# Patient Record
Sex: Female | Born: 1965 | Race: White | Hispanic: No | Marital: Married | Smoking: Never smoker
Health system: Southern US, Community
[De-identification: ages and names within clinical notes are randomized; demographics above are authoritative.]

## PROBLEM LIST (undated history)

## (undated) DIAGNOSIS — K635 Polyp of colon: Secondary | ICD-10-CM

## (undated) DIAGNOSIS — H269 Unspecified cataract: Secondary | ICD-10-CM

## (undated) DIAGNOSIS — M722 Plantar fascial fibromatosis: Secondary | ICD-10-CM

## (undated) DIAGNOSIS — N879 Dysplasia of cervix uteri, unspecified: Secondary | ICD-10-CM

## (undated) HISTORY — DX: Polyp of colon: K63.5

## (undated) HISTORY — PX: COLPOSCOPY: SHX161

## (undated) HISTORY — DX: Plantar fascial fibromatosis: M72.2

## (undated) HISTORY — DX: Unspecified cataract: H26.9

## (undated) HISTORY — PX: CATARACT EXTRACTION: SUR2

## (undated) HISTORY — DX: Dysplasia of cervix uteri, unspecified: N87.9

---
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
## 1998-01-15 DIAGNOSIS — N879 Dysplasia of cervix uteri, unspecified: Secondary | ICD-10-CM

## 1998-01-15 HISTORY — DX: Dysplasia of cervix uteri, unspecified: N87.9

## 1998-06-06 ENCOUNTER — Other Ambulatory Visit: Admission: RE | Admit: 1998-06-06 | Discharge: 1998-06-06 | Payer: Self-pay | Admitting: Gynecology

## 1999-08-29 ENCOUNTER — Other Ambulatory Visit: Admission: RE | Admit: 1999-08-29 | Discharge: 1999-08-29 | Payer: Self-pay | Admitting: Gynecology

## 2001-03-05 ENCOUNTER — Other Ambulatory Visit: Admission: RE | Admit: 2001-03-05 | Discharge: 2001-03-05 | Payer: Self-pay | Admitting: Gynecology

## 2001-06-05 ENCOUNTER — Encounter: Admission: RE | Admit: 2001-06-05 | Discharge: 2001-06-05 | Payer: Self-pay | Admitting: Gynecology

## 2001-06-05 ENCOUNTER — Encounter: Payer: Self-pay | Admitting: Gynecology

## 2002-03-06 ENCOUNTER — Other Ambulatory Visit: Admission: RE | Admit: 2002-03-06 | Discharge: 2002-03-06 | Payer: Self-pay | Admitting: Gynecology

## 2003-03-15 ENCOUNTER — Other Ambulatory Visit: Admission: RE | Admit: 2003-03-15 | Discharge: 2003-03-15 | Payer: Self-pay | Admitting: Gynecology

## 2004-03-17 ENCOUNTER — Other Ambulatory Visit: Admission: RE | Admit: 2004-03-17 | Discharge: 2004-03-17 | Payer: Self-pay | Admitting: Gynecology

## 2004-12-25 ENCOUNTER — Emergency Department (HOSPITAL_COMMUNITY): Admission: EM | Admit: 2004-12-25 | Discharge: 2004-12-25 | Payer: Self-pay | Admitting: Emergency Medicine

## 2005-01-01 ENCOUNTER — Ambulatory Visit: Payer: Self-pay | Admitting: Family Medicine

## 2005-03-05 ENCOUNTER — Inpatient Hospital Stay (HOSPITAL_COMMUNITY): Admission: AD | Admit: 2005-03-05 | Discharge: 2005-03-05 | Payer: Self-pay | Admitting: Gynecology

## 2005-03-06 ENCOUNTER — Inpatient Hospital Stay (HOSPITAL_COMMUNITY): Admission: AD | Admit: 2005-03-06 | Discharge: 2005-03-09 | Payer: Self-pay | Admitting: Gynecology

## 2005-03-06 ENCOUNTER — Encounter (INDEPENDENT_AMBULATORY_CARE_PROVIDER_SITE_OTHER): Payer: Self-pay | Admitting: Specialist

## 2005-04-24 ENCOUNTER — Other Ambulatory Visit: Admission: RE | Admit: 2005-04-24 | Discharge: 2005-04-24 | Payer: Self-pay | Admitting: Gynecology

## 2005-07-14 ENCOUNTER — Ambulatory Visit: Payer: Self-pay | Admitting: Internal Medicine

## 2005-12-27 ENCOUNTER — Ambulatory Visit: Payer: Self-pay | Admitting: Internal Medicine

## 2006-05-03 ENCOUNTER — Other Ambulatory Visit: Admission: RE | Admit: 2006-05-03 | Discharge: 2006-05-03 | Payer: Self-pay | Admitting: Gynecology

## 2006-06-20 ENCOUNTER — Ambulatory Visit: Payer: Self-pay | Admitting: Internal Medicine

## 2006-06-25 ENCOUNTER — Encounter: Admission: RE | Admit: 2006-06-25 | Discharge: 2006-06-25 | Payer: Self-pay | Admitting: Gynecology

## 2006-07-25 ENCOUNTER — Ambulatory Visit: Payer: Self-pay | Admitting: Family Medicine

## 2006-07-26 DIAGNOSIS — G43909 Migraine, unspecified, not intractable, without status migrainosus: Secondary | ICD-10-CM | POA: Insufficient documentation

## 2006-08-27 ENCOUNTER — Ambulatory Visit: Payer: Self-pay | Admitting: Family Medicine

## 2006-08-27 DIAGNOSIS — R51 Headache: Secondary | ICD-10-CM | POA: Insufficient documentation

## 2006-08-27 DIAGNOSIS — R519 Headache, unspecified: Secondary | ICD-10-CM | POA: Insufficient documentation

## 2006-08-27 DIAGNOSIS — D1779 Benign lipomatous neoplasm of other sites: Secondary | ICD-10-CM | POA: Insufficient documentation

## 2006-09-21 ENCOUNTER — Inpatient Hospital Stay (HOSPITAL_COMMUNITY): Admission: AD | Admit: 2006-09-21 | Discharge: 2006-09-21 | Payer: Self-pay | Admitting: Obstetrics and Gynecology

## 2006-12-23 ENCOUNTER — Ambulatory Visit: Payer: Self-pay | Admitting: Internal Medicine

## 2006-12-23 ENCOUNTER — Telehealth: Payer: Self-pay | Admitting: Internal Medicine

## 2007-06-17 ENCOUNTER — Telehealth (INDEPENDENT_AMBULATORY_CARE_PROVIDER_SITE_OTHER): Payer: Self-pay

## 2007-06-17 ENCOUNTER — Ambulatory Visit: Payer: Self-pay | Admitting: Internal Medicine

## 2007-06-17 DIAGNOSIS — K625 Hemorrhage of anus and rectum: Secondary | ICD-10-CM | POA: Insufficient documentation

## 2007-06-18 ENCOUNTER — Ambulatory Visit: Payer: Self-pay | Admitting: Internal Medicine

## 2007-06-18 DIAGNOSIS — R198 Other specified symptoms and signs involving the digestive system and abdomen: Secondary | ICD-10-CM | POA: Insufficient documentation

## 2007-06-18 DIAGNOSIS — K644 Residual hemorrhoidal skin tags: Secondary | ICD-10-CM | POA: Insufficient documentation

## 2007-10-30 ENCOUNTER — Inpatient Hospital Stay (HOSPITAL_COMMUNITY): Admission: RE | Admit: 2007-10-30 | Discharge: 2007-11-02 | Payer: Self-pay | Admitting: Obstetrics and Gynecology

## 2008-09-06 ENCOUNTER — Encounter: Admission: RE | Admit: 2008-09-06 | Discharge: 2008-09-06 | Payer: Self-pay | Admitting: Gynecology

## 2008-09-09 ENCOUNTER — Encounter: Admission: RE | Admit: 2008-09-09 | Discharge: 2008-09-09 | Payer: Self-pay | Admitting: Gynecology

## 2008-12-16 ENCOUNTER — Other Ambulatory Visit: Admission: RE | Admit: 2008-12-16 | Discharge: 2008-12-16 | Payer: Self-pay | Admitting: Gynecology

## 2008-12-16 ENCOUNTER — Ambulatory Visit: Payer: Self-pay | Admitting: Gynecology

## 2009-01-24 ENCOUNTER — Ambulatory Visit: Payer: Self-pay | Admitting: Family Medicine

## 2009-03-21 ENCOUNTER — Ambulatory Visit: Payer: Self-pay | Admitting: Internal Medicine

## 2009-03-21 LAB — CONVERTED CEMR LAB
Blood in Urine, dipstick: NEGATIVE
Glucose, Urine, Semiquant: NEGATIVE
Ketones, urine, test strip: NEGATIVE
Nitrite: NEGATIVE
Protein, U semiquant: NEGATIVE
Specific Gravity, Urine: 1.01
WBC Urine, dipstick: NEGATIVE
pH: 7.5

## 2009-03-25 ENCOUNTER — Encounter: Admission: RE | Admit: 2009-03-25 | Discharge: 2009-03-25 | Payer: Self-pay | Admitting: Internal Medicine

## 2009-03-29 ENCOUNTER — Encounter: Admission: RE | Admit: 2009-03-29 | Discharge: 2009-03-29 | Payer: Self-pay | Admitting: Internal Medicine

## 2009-06-20 ENCOUNTER — Encounter: Payer: Self-pay | Admitting: Family Medicine

## 2009-06-20 ENCOUNTER — Telehealth: Payer: Self-pay | Admitting: Family Medicine

## 2009-06-20 ENCOUNTER — Ambulatory Visit: Payer: Self-pay | Admitting: Internal Medicine

## 2009-06-20 DIAGNOSIS — S8010XA Contusion of unspecified lower leg, initial encounter: Secondary | ICD-10-CM | POA: Insufficient documentation

## 2009-06-20 DIAGNOSIS — M545 Low back pain, unspecified: Secondary | ICD-10-CM | POA: Insufficient documentation

## 2009-06-20 LAB — CONVERTED CEMR LAB
Bilirubin Urine: NEGATIVE
Ketones, urine, test strip: NEGATIVE
Specific Gravity, Urine: 1.02

## 2009-06-21 ENCOUNTER — Encounter: Payer: Self-pay | Admitting: Internal Medicine

## 2009-06-21 ENCOUNTER — Telehealth: Payer: Self-pay | Admitting: *Deleted

## 2009-06-21 ENCOUNTER — Telehealth (INDEPENDENT_AMBULATORY_CARE_PROVIDER_SITE_OTHER): Payer: Self-pay | Admitting: *Deleted

## 2009-06-29 ENCOUNTER — Telehealth: Payer: Self-pay | Admitting: *Deleted

## 2009-06-30 ENCOUNTER — Ambulatory Visit: Payer: Self-pay | Admitting: Family Medicine

## 2009-06-30 DIAGNOSIS — J309 Allergic rhinitis, unspecified: Secondary | ICD-10-CM | POA: Insufficient documentation

## 2009-07-31 ENCOUNTER — Encounter: Payer: Self-pay | Admitting: Internal Medicine

## 2009-08-24 ENCOUNTER — Ambulatory Visit: Payer: Self-pay | Admitting: Family Medicine

## 2009-08-24 LAB — CONVERTED CEMR LAB
Bilirubin Urine: NEGATIVE
Glucose, Urine, Semiquant: NEGATIVE
Ketones, urine, test strip: NEGATIVE
Specific Gravity, Urine: <1.005
pH: 6.5

## 2009-08-25 ENCOUNTER — Encounter: Payer: Self-pay | Admitting: Internal Medicine

## 2009-10-14 ENCOUNTER — Encounter: Admission: RE | Admit: 2009-10-14 | Discharge: 2009-10-14 | Payer: Self-pay | Admitting: Gynecology

## 2009-12-06 ENCOUNTER — Ambulatory Visit: Payer: Self-pay | Admitting: Internal Medicine

## 2009-12-19 ENCOUNTER — Other Ambulatory Visit
Admission: RE | Admit: 2009-12-19 | Discharge: 2009-12-19 | Payer: Self-pay | Source: Home / Self Care | Admitting: Gynecology

## 2009-12-19 ENCOUNTER — Ambulatory Visit: Payer: Self-pay | Admitting: Gynecology

## 2010-02-05 ENCOUNTER — Encounter: Payer: Self-pay | Admitting: Gynecology

## 2010-02-14 NOTE — Assessment & Plan Note (Signed)
Summary: flu shot/njr  Nurse Visit   Allergies: 1)  Cipro  Orders Added: 1)  Admin 1st Vaccine [90471] 2)  Flu Vaccine 43yrs + [11914]  Flu Vaccine Consent Questions     Do you have a history of severe allergic reactions to this vaccine? no    Any prior history of allergic reactions to egg and/or gelatin? no    Do you have a sensitivity to the preservative Thimersol? no    Do you have a past history of Guillan-Barre Syndrome? no    Do you currently have an acute febrile illness? no    Have you ever had a severe reaction to latex? no    Vaccine information given and explained to patient? yes    Are you currently pregnant? no    Lot Number:[id number]   Exp Date:07/15/2010   Site Given  Left Deltoid IM [name], CMA Duncan Dull)  December 06, 2009 9:19 AM

## 2010-02-14 NOTE — Letter (Signed)
Summary: [facility]  [facility]   Imported By: Maryln Gottron 08/03/2009 14:25:37  _____________________________________________________________________  External Attachment:    Type:   Image     Comment:   External Document

## 2010-02-14 NOTE — Assessment & Plan Note (Signed)
Summary: COUGH,CONGESTION // RS   Vital Signs:  Patient profile:   45 year old female Height:      63 inches Weight:      141 pounds BMI:     25.07 Temp:     100.3 degrees F oral BP sitting:   102 / 64  (left arm) Cuff size:   regular  Vitals Entered By: Kern Reap CMA Duncan Dull) (January 24, 2009 3:27 PM)  Reason for Visit cough and congestion  Allergies (verified): No Known Drug Allergies   Other Orders: No Charge Patient Arrived (NCPA0) (NCPA0)

## 2010-02-14 NOTE — Progress Notes (Signed)
Summary: Reaction to Cipro  Phone Note Call from Patient Call back at [phone]   Caller: Patient Summary of Call: Pt broke out in a red rash due to cipro all over abd. and back. Pt only took 1 dose of Cipro and took dramine as well to help with the rash. There was no itching with it. Pt took the cipro at 11:30am but hasn't took another dose. Pt didn't have any swelling or sob with it. Pt uses [facility]. Initial call taken by: [name], CMA (AAMA),  June 21, 2009 9:04 AM  Follow-up for Phone Call        change to macrobid   100 1 by mouth two times a day  disp 14  after rash subsides .Marland KitchenMarland KitchenMarland KitchenCheck on Urine culture in the meantiime agree with stopping  cipro.  ( if rash still present  take a picture for future ref incase we need more information) Follow-up by: Madelin Headings MD,  June 21, 2009 12:46 PM  Additional Follow-up for Phone Call Additional follow up Details #1::        Pt aware and rx sent to pharmacy. Additional Follow-up by: [name], CMA (AAMA),  June 21, 2009 1:07 PM    New/Updated Medications: MACROBID 100 MG CAPS (NITROFURANTOIN MONOHYD MACRO) 1 by mouth two times a day Prescriptions: MACROBID 100 MG CAPS (NITROFURANTOIN MONOHYD MACRO) 1 by mouth two times a day  #14 x 0   Entered by:   [name], CMA (AAMA)   Authorized by:   Madelin Headings MD   Signed by:   [name], CMA (AAMA) on 06/21/2009   Method used:   Electronically to        [phone][facility] (retail)       [address]       [place], [place]  [address]       Ph: [phone] or [phone]       Fax: [phone]   RxID:   [id number]

## 2010-02-14 NOTE — Assessment & Plan Note (Signed)
Summary: sinus infection/ssc   Vital Signs:  Patient profile:   45 year old female Menstrual status:  regular Weight:      149 pounds Temp:     98.7 degrees F oral BP sitting:   100 / 60  (left arm) Cuff size:   regular  Vitals Entered By: Kathrynn Speed CMA (June 30, 2009 11:59 AM) CC: sinus infection   Primary Care Provider:  Shirley Friar  CC:  sinus infection.  History of Present Illness: Ellen Hayden is a 45 year old, married female, nonsmoker, who comes in today for evaluation of allergic rhinitis.  She states she was seen about 10 days ago with UTI and was given Cipro.  Eight hours after her first dose she broke out in total body hives.  She stopped the Cipro waited for a couple days and the hives resolved spontaneously without any medication.  She then started Macrobid b.i.d. for one week.  Her last dose was yesterday.  No urinary tract complaints.  She has had a flare of her allergic rhinitis with head congestion, postnasal drip, and cough.  No history of asthma.  Current Medications (verified): 1)  Zyrtec Allergy 10 Mg Caps (Cetirizine Hcl) .Marland Kitchen.. 1 Tab Every 24 Hours  Allergies (verified): 1)  Cipro  Past History:  Past medical, surgical, family and social histories (including risk factors) reviewed for relevance to current acute and chronic problems.  Past Medical History: Reviewed history from 03/21/2009 and no changes required. Migraine/Headache Low Iron - Not Anemic g3 p2   Past Surgical History: Reviewed history from 06/18/2007 and no changes required. Strabysmis-1970 Inguinal  Hernia-1969 C-section- 2007  Family History: Reviewed history from 03/21/2009 and no changes required. Family History Hypertension neg colon cancer  Family History of Colon Polyps: elderly grandmother  had a urinary diversion.  Family History of Melanoma   mom  Social History: Reviewed history from 03/21/2009 and no changes required. Married Never Smoked Alcohol  use-no Regular exercise-yes  Occupation: Airline pilot Drug use-no  Review of Systems      See HPI  Physical Exam  General:  Well-developed,well-nourished,in no acute distress; alert,appropriate and cooperative throughout examination Head:  Normocephalic and atraumatic without obvious abnormalities. No apparent alopecia or balding. Eyes:  No corneal or conjunctival inflammation noted. EOMI. Perrla. Funduscopic exam benign, without hemorrhages, exudates or papilledema. Vision grossly normal. Ears:  External ear exam shows no significant lesions or deformities.  Otoscopic examination reveals clear canals, tympanic membranes are intact bilaterally without bulging, retraction, inflammation or discharge. Hearing is grossly normal bilaterally. Nose:  septum in the midline, 4+ nasal edema Mouth:  Oral mucosa and oropharynx without lesions or exudates.  Teeth in good repair. Neck:  No deformities, masses, or tenderness noted. Lungs:  Normal respiratory effort, chest expands symmetrically. Lungs are clear to auscultation, no crackles or wheezes.   Problems:  Medical Problems Added: 1)  Dx of Rhinitis  (ICD-477.9)  Impression & Recommendations:  Problem # 1:  RHINITIS (ICD-477.9) Assessment Deteriorated  Her updated medication list for this problem includes:    Zyrtec Allergy 10 Mg Caps (Cetirizine hcl) .Marland Kitchen... 1 tab every 24 hours  Orders: Prescription Created Electronically [phone])  Complete Medication List: 1)  Zyrtec Allergy 10 Mg Caps (Cetirizine hcl) .Marland Kitchen.. 1 tab every 24 hours 2)  Prednisone 20 Mg Tabs (Prednisone) .... Uad  Patient Instructions: 1)  begin prednisone, take one tablet x 3 days, a half x 3 days, then half a tablet Monday, Wednesday, Friday, for a two week taper. 2)  Also, Afrin nasal spray, followed by the warm salt water nasal spray in a netti pot at bedtime.  Remember.  There is a 5 nite  limit on the afrin . 3)  when you're finished with the prednisone .........  take plain Claritin in the morning or plain Zyrtec at bedtime daily Prescriptions: PREDNISONE 20 MG TABS (PREDNISONE) UAD  #40 x 1   Entered and Authorized by:   Roderick Pee MD   Signed by:   Roderick Pee MD on 06/30/2009   Method used:   Electronically to        [facility] [phone]* (retail)       [address]       [place], [place]  [address]       Ph: [phone] or [phone]       Fax: [phone]   RxID:   [id number]
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
## 2010-02-14 NOTE — Assessment & Plan Note (Signed)
Summary: ? ab pain//ccm   Vital Signs:  Patient profile:   45 year old female Menstrual status:  regular LMP:     03/08/2009 Weight:      141 pounds Temp:     98.4 degrees F oral Pulse rate:   60 / minute BP sitting:   110 / 60  (right arm) Cuff size:   regular  Vitals Entered By: [name], CMA (AAMA) (March 21, 2009 2:20 PM) CC: LLQ pain that starts when she running. This happened a few weeks ago. Then goes away but this weekend it stayed there. Pt states that it is more of a sharp pain. Pt does still have blood in stools but figures that is due to hemmroids. LMP (date): 03/08/2009 LMP - Character: normal Menarche (age onset years): 14   Menses interval (days): 23-28 Menstrual flow (days): 5 Menstrual Status regular Enter LMP: 03/08/2009   History of Present Illness: Ellen Hayden come in today for    SDA for  discomfort  in  abdomen lower and upper  only when runs .  about 3-5 miles into her run she notes discomfort  in left abdomen. No associated cpsob NVD .  alsways tend to be constipate and no change on bowel habits. No fever. Periods ok . No gu signs . No swelling and no problem with coughing or bending. No joint problems or groin or back pain.   remote hx of gross painless  hematuria  when young  and  had   uro evaluation normal   withoutdx and another 6-7 year ago  and treated   for uti and got better .   never had  stone diagnosed.        No fam hx.    Preventive Screening-Counseling & Management  Alcohol-Tobacco     Alcohol drinks/day: <1     Alcohol type: wine     Smoking Status: never  Caffeine-Diet-Exercise     Caffeine use/day: 1-2     Does Patient Exercise: yes     Type of exercise: running  Current Medications (verified): 1)  None  Allergies (verified): No Known Drug Allergies  Past History:  Past medical, surgical, family and social histories (including risk factors) reviewed, and no changes noted (except as noted below).  Past  Medical History: Migraine/Headache Low Iron - Not Anemic g3 p2   Past Surgical History: Reviewed history from 06/18/2007 and no changes required. Strabysmis-1970 Inguinal  Hernia-1969 C-section- 2007  Past History:  Care Management: Gastroenterology: Corinda Gubler GI Gynecology: Audie Box Orthopedics: Tomasita Crumble  Family History: Reviewed history from 01/24/2009 and no changes required. Family History Hypertension neg colon cancer  Family History of Colon Polyps: elderly grandmother  had a urinary diversion.  Family History of Melanoma   mom  Social History: Reviewed history from 06/18/2007 and no changes required. Married Never Smoked Alcohol use-no Regular exercise-yes  Occupation: Airline pilot Drug use-no Does Patient Exercise:  yes Caffeine use/day:  1-2  Review of Systems  The patient denies anorexia, fever, weight loss, weight gain, vision loss, decreased hearing, hoarseness, chest pain, syncope, dyspnea on exertion, peripheral edema, prolonged cough, headaches, hemoptysis, melena, hematochezia, severe indigestion/heartburn, difficulty walking, depression, abnormal bleeding, enlarged lymph nodes, and angioedema.         rectal blood with  difficult  movement  .  Physical Exam  General:  Well-developed,well-nourished,in no acute distress; alert,appropriate and cooperative throughout examination Head:  normocephalic and atraumatic.   Eyes:  vision grossly intact, pupils equal,  and pupils round.   Neck:  No deformities, masses, or tenderness noted. Lungs:  Normal respiratory effort, chest expands symmetrically. Lungs are clear to auscultation, no crackles or wheezes. Heart:  Normal rate and regular rhythm. S1 and S2 normal without gallop, murmur, click, rub or other extra sounds. Abdomen:  Bowel sounds positive,abdomen soft and non-tender without masses, organomegaly or hernias noted.point to llq and left uq  and lateral neg psoas sign and neg with jumping.  soft,  non-tender, normal bowel sounds, no distention, no masses, no guarding, no rigidity, no rebound tenderness, no abdominal hernia, no inguinal hernia, no hepatomegaly, and no splenomegaly.   Pulses:  nl cap refill  Neurologic:  non focal  Skin:  turgor normal, color normal, no ecchymoses, no petechiae, and no purpura.   Cervical Nodes:  No lymphadenopathy noted Inguinal Nodes:  No significant adenopathy Psych:  Oriented X3, good eye contact, not anxious appearing, and not depressed appearing.     Impression & Recommendations:  Problem # 1:  ABDOMINAL PAIN OTHER SPECIFIED SITE LEFT (ICD-789.09)  atypical and only with  continued exercise  remiscent or a hernia ( not found) of a ms cause .   disc  about htis    . because oflocation and hx of hematuria in past ? cause will get abd renal  pelvic US.    follow up if persistent and progressive   .   Orders: UA Dipstick w/o Micro (automated)  (81003) Radiology Referral (Radiology)  Problem # 2:  EXTERNAL HEMORRHOIDS (ICD-455.3) hx of same evaluated     Patient Instructions: 1)  back off on   length of  exercise run  2)  cross train.  Marland Kitchen   3)  will  schedule  abdominal renal ultrasound.     4)  If persistent and progressive  we can get more evaluation. 5)  call if fever  change in bowel habits   Laboratory Results   Urine Tests    Routine Urinalysis   Color: yellow Appearance: Clear Glucose: negative   (Normal Range: Negative) Bilirubin: negative   (Normal Range: Negative) Ketone: negative   (Normal Range: Negative) Spec. Gravity: 1.010   (Normal Range: 1.003-1.035) Blood: negative   (Normal Range: Negative) pH: 7.5   (Normal Range: 5.0-8.0) Protein: negative   (Normal Range: Negative) Urobilinogen: 0.2   (Normal Range: 0-1) Nitrite: negative   (Normal Range: Negative) Leukocyte Esterace: negative   (Normal Range: Negative)    Comments: Rita Ohara  March 21, 2009 2:37 PM

## 2010-02-14 NOTE — Progress Notes (Signed)
Summary: sinus infection  Phone Note Call from Patient Call back at [phone]   Summary of Call: Pt is has been taking gen zyrtec but forgot to take it for 3 days. Sinus pressure, coughing and congestion with a sinus ha that could have been travel related last wed. This has been going on for 1 week. Scheduled pt tomorrow with Dr. Tawanna Cooler. Initial call taken by: [name], CMA (AAMA),  June 29, 2009 4:07 PM

## 2010-02-14 NOTE — Progress Notes (Signed)
Summary: Pt wants to change md's  Phone Note Call from Patient Call back at Home Phone [phone]   Caller: Patient Summary of Call: Pt wants to change to Dr. Fabian Sharp. Initial call taken by: [name], CMA Duncan Dull),  June 20, 2009 9:57 AM  Follow-up for Phone Call        ok Follow-up by: Roderick Pee MD,  June 20, 2009 10:23 AM  Additional Follow-up for Phone Call Additional follow up Details #1::        ok Additional Follow-up by: Madelin Headings MD,  June 20, 2009 1:20 PM     Laboratory Results   Urine Tests  Date/Time Recieved: June 20, 2009 10:05 AM  Date/Time Reported: June 20, 2009 10:05 AM   Routine Urinalysis   Color: yellow Appearance: Clear Glucose: negative   (Normal Range: Negative) Bilirubin: negative   (Normal Range: Negative) Ketone: negative   (Normal Range: Negative) Spec. Gravity: 1.020   (Normal Range: 1.003-1.035) Blood: 1+   (Normal Range: Negative) pH: 6.5   (Normal Range: 5.0-8.0) Protein: trace   (Normal Range: Negative) Urobilinogen: 0.2   (Normal Range: 0-1) Nitrite: negative   (Normal Range: Negative) Leukocyte Esterace: trace   (Normal Range: Negative)    Comments: Wynona Canes, CMA  June 20, 2009 10:05 AM

## 2010-02-14 NOTE — Progress Notes (Signed)
Summary: Call-A-Nurse Report      New Allergies: CIPRO New Allergies: CIPRO  Call-A-Nurse Triage Call Report Triage Record Num: [id number] Operator: Caswell Corwin Patient Name: Ellen Hayden Call Date & Time: 06/20/2009 8:58:30PM Patient Phone: [phone] PCP: [facility] Patient Gender: Female PCP Fax : [phone] Patient DOB: [birth year] [facility] Reason for Call: Pt calling that she was prescribed Cipro 500 mg for UTI taht she started this AM. Has rash on spine, abd and around neck. Temp 100.0 at 1700. Rash not itchy. Triaged allergic reaction and having some sx. Home care and call back inst given. Inst may take Benadryl. Protocol(s) Used: Allergic Reaction; Known / Suspected Recommended Outcome per Protocol: See Provider within 24 hours Reason for Outcome: New onset rash, joint pain, muscle aches, swollen glands or any temperature elevation without known cause AND less than 10 days after starting new medication(s) Care Advice:  ~ List, or take, all current prescription(s), OTC or alternative medication(s) to provider for evaluation. MEDICATION ADVICE: - Tell provider all prescription, OTC or alternative medications that you take. - Take medications as prescribed, following label instructions for the medication. - Know possible side effects of medication and what to do if they occur. - Do not change medications or dosing regimen until provider is consulted. - Discontinue all OTC and alternative medications, especially stimulants, until evaluated by provider.  ~  ~ Speak with provider before next dose of medication is due. If an allergy is identified, tell all healthcare providers of your allergy. Even if a first-time reaction caused mild symptoms, a future response to the same allergen may cause more serious symptoms. Wear a medical alert ID to alert others in case of an emergency.  ~ Call EMS [phone] if develop signs and symptoms of  anaphylaxis: severe difficulty breathing; rapid, weak pulse; swelling of face, lips, tongue or throat causing throat tightness or difficulty swallowing; sudden onset of nausea, vomiting or diarrhea; loss of consciousness.  ~ Avoid allergy triggers that have caused any reaction. Read labels on food or medications carefully; ask about ingredients in food when eating out. If allergic to stinging insects, wear long-sleeved shirts and trousers and closed toe shoes. Do not walk barefoot. Ask healthcare provider about carrying emergency allergy medication.  ~ 06/20/2009 9:08:30PM Page 1 of 1 [id number]

## 2010-02-14 NOTE — Assessment & Plan Note (Signed)
Summary: ?UTI/NJR   Vital Signs:  Patient profile:   45 year old female Menstrual status:  regular Weight:      147 pounds BMI:     26.13 Temp:     98.4 degrees F oral BP sitting:   112 / 72  (left arm) Cuff size:   regular  Vitals Entered By: Raechel Ache, RN (August 24, 2009 4:04 PM) CC: C/o dysuria, frequency - [facility] and referred here. Had UTI in June and then sinus infection in July.   History of Present Illness: here for 2 days of urinary burning, urgency, and low back aches. No nausea or fever. Drinking plenty of water. She was seen here in early July with a UTI, and her culture grew 45,000 cfu of E. coli. She tooka few days of Cipro, but had to stop this due to an allergic rash. Then she tooka ful course of Macrobid, and the symptoms went away. She went to a [facility] this am, and they sent her here suspecting a "kidney Infection".   Allergies: 1)  Cipro  Past History:  Past Medical History: Reviewed history from 03/21/2009 and no changes required. Migraine/Headache Low Iron - Not Anemic g3 p2   Past Surgical History: Reviewed history from 06/18/2007 and no changes required. Strabysmis-1970 Inguinal  Hernia-1969 C-section- 2007  Review of Systems  The patient denies anorexia, fever, weight loss, weight gain, vision loss, decreased hearing, hoarseness, chest pain, syncope, dyspnea on exertion, peripheral edema, prolonged cough, headaches, hemoptysis, melena, hematochezia, severe indigestion/heartburn, hematuria, incontinence, genital sores, muscle weakness, suspicious skin lesions, transient blindness, difficulty walking, depression, unusual weight change, abnormal bleeding, enlarged lymph nodes, angioedema, breast masses, and testicular masses.    Physical Exam  General:  Well-developed,well-nourished,in no acute distress; alert,appropriate and cooperative throughout examination Lungs:  Normal respiratory effort, chest expands  symmetrically. Lungs are clear to auscultation, no crackles or wheezes. Heart:  Normal rate and regular rhythm. S1 and S2 normal without gallop, murmur, click, rub or other extra sounds. Abdomen:  soft, normal bowel sounds, no distention, no masses, no guarding, no rigidity, no rebound tenderness, no abdominal hernia, no inguinal hernia, no hepatomegaly, and no splenomegaly.  Mildly tender in both lower quadrants   Impression & Recommendations:  Problem # 1:  UTI (ICD-599.0)  Her updated medication list for this problem includes:    Bactrim Ds 800-160 Mg Tabs (Sulfamethoxazole-trimethoprim) .Marland Kitchen..Marland Kitchen Two times a day  Orders: T-Culture, Urine ([id number]) UA Dipstick w/o Micro (manual) (14782)  Complete Medication List: 1)  Zyrtec Allergy 10 Mg Caps (Cetirizine hcl) .Marland Kitchen.. 1 tab every 24 hours 2)  Bactrim Ds 800-160 Mg Tabs (Sulfamethoxazole-trimethoprim) .... Two times a day  Patient Instructions: 1)  Try 10 days of Bactrim DS. Await culture results Prescriptions: BACTRIM DS 800-160 MG TABS (SULFAMETHOXAZOLE-TRIMETHOPRIM) two times a day  #20 x 0   Entered and Authorized by:   Nelwyn Salisbury MD   Signed by:   Nelwyn Salisbury MD on 08/24/2009   Method used:   Electronically to        [id number] (retail)       [address]       [place], [place]  [address]       Ph: [phone] or [phone]       Fax: [phone]   RxID:   [id number]   Laboratory Results   Urine Tests    Routine Urinalysis   Color: yellow Appearance: Hazy Glucose:  negative   (Normal Range: Negative) Bilirubin: negative   (Normal Range: Negative) Ketone: negative   (Normal Range: Negative) Spec. Gravity: <1.005   (Normal Range: 1.003-1.035) Blood: trace-lysed   (Normal Range: Negative) pH: 6.5   (Normal Range: 5.0-8.0) Protein: trace   (Normal Range: Negative) Urobilinogen: 0.2   (Normal Range: 0-1) Nitrite: positive   (Normal Range: Negative) Leukocyte Esterace: large   (Normal Range:  Negative)

## 2010-02-14 NOTE — Assessment & Plan Note (Signed)
Summary: Ellen Hayden // RS   Primary Care Provider:  Shirley Friar   History of Present Illness: Ellen Hayden is a 45 year old female, G2, P2, nonsmoker, married, who comes in with a 3-day history of congestion, sore throat, and a cough.  She's had no fever, sputum production, shortness of breath, et Karie Soda.  Two children, and husband are well.  No previous history of lung problems, when she gets a bad cold  Allergies: No Known Drug Allergies  Family History: Reviewed history from 06/18/2007 and no changes required. Family History Hypertension neg colon cancer  Family History of Colon Polyps: elderly grandmother Family History of Melanoma   mom  Social History: Reviewed history from 06/18/2007 and no changes required. Married Never Smoked Alcohol use-no Regular exercise-no Occupation: Airline pilot Drug use-no  Review of Systems      See HPI  Physical Exam  General:  Well-developed,well-nourished,in no acute distress; alert,appropriate and cooperative throughout examination Head:  Normocephalic and atraumatic without obvious abnormalities. No apparent alopecia or balding. Eyes:  No corneal or conjunctival inflammation noted. EOMI. Perrla. Funduscopic exam benign, without hemorrhages, exudates or papilledema. Vision grossly normal. Ears:  External ear exam shows no significant lesions or deformities.  Otoscopic examination reveals clear canals, tympanic membranes are intact bilaterally without bulging, retraction, inflammation or discharge. Hearing is grossly normal bilaterally. Nose:  External nasal examination shows no deformity or inflammation. Nasal mucosa are pink and moist without lesions or exudates. Mouth:  Oral mucosa and oropharynx without lesions or exudates.  Teeth in good repair. Neck:  No deformities, masses, or tenderness noted. Chest Wall:  No deformities, masses, or tenderness noted. Lungs:  Normal respiratory effort, chest expands symmetrically. Lungs are clear to  auscultation, no crackles or wheezes.   Impression & Recommendations:  Problem # 1:  URI (ICD-465.9) Assessment Deteriorated  Her updated medication list for this problem includes:    Hydromet 5-1.5 Mg/80ml Syrp (Hydrocodone-homatropine) .Marland Kitchen... 1 or 2 tsps three times a day as needed  Orders: Prescription Created Electronically [phone])  Complete Medication List: 1)  Hydromet 5-1.5 Mg/36ml Syrp (Hydrocodone-homatropine) .Marland Kitchen.. 1 or 2 tsps three times a day as needed  Patient Instructions: 1)  Get plenty of rest, drink lots of clear liquids, and use Tylenol or Ibuprofen for fever and comfort. Return in 7-10 days if you're not better:sooner if you're feeling worse. 2)  Take 650-1000mg  of Tylenol every 4-6 hours as needed for relief of pain or comfort of fever AVOID taking more than 4000mg   in a 24 hour period (can cause liver damage in higher doses). 3)  image take Hydromet  one to 2 teaspoons t.i.d. p.r.n. cough, cold, return p.r.n. Prescriptions: HYDROMET 5-1.5 MG/5ML SYRP (HYDROCODONE-HOMATROPINE) 1 or 2 tsps three times a day as needed  #8oz x 1   Entered and Authorized by:   Roderick Pee MD   Signed by:   Roderick Pee MD on 01/24/2009   Method used:   Print then Give to Patient   RxID:   [id number]

## 2010-05-30 NOTE — H&P (Signed)
NAMENAYDELINE, MORACE            ACCOUNT NO.:  [id number]   MEDICAL RECORD NO.:  [id number]          PATIENT TYPE:  MAT   LOCATION:  [facility]                          [facility]   PHYSICIAN:  Dineen Kid. Rana Snare, M.D.    DATE OF BIRTH:  15-Jul-1965   DATE OF ADMISSION:  DATE OF DISCHARGE:                              HISTORY & PHYSICAL   HISTORY OF PRESENT ILLNESS:  Ms. Shoff is a 45 year old G3, P1, A1 at  [redacted] weeks gestational age who presents for repeat cesarean section.  Her  pregnancy has been complicated by advanced maternal age with normal  first trimester screen and declined amniocentesis.  She is GBS negative  with an EDC of November 04, 2007.  She presents for repeat cesarean  section.   PAST MEDICAL HISTORY:  Negative.   PAST SURGICAL HISTORY:  She has had a previous cesarean section for  macrosomia.  She has also had a miscarriage   MEDICATIONS:  She takes prenatal vitamins.   ALLERGIES:  NO KNOWN DRUG ALLERGIES.   PHYSICAL EXAMINATION:  VITAL SIGNS:  Blood pressure is 112-62.  HEENT:  Regular rate and rhythm.  LUNGS:  Clear to auscultation bilaterally.  ABDOMEN:  Gravid, nontender.  Fetal heart tones in the 150s.  GU:  Cervix was closed, thick and high.   IMPRESSION/PLAN:  Intrauterine pregnancy at 39+ weeks, previous cesarean  section, desires repeat.   PLAN:  Repeat low segment transverse cesarean section.  Risks and  benefits of the procedure were discussed at length.  Informed consent  was obtained.      Dineen Kid Rana Snare, M.D.  Electronically Signed     DCL/MEDQ  D:  10/29/2007  T:  10/29/2007  Job:  [id number]

## 2010-05-30 NOTE — Op Note (Signed)
[name], Ellen Hayden            ACCOUNT NO.:  [id number]   MEDICAL RECORD NO.:  [id number]          PATIENT TYPE:  INP   LOCATION:  9134                          [facility]   PHYSICIAN:  Dineen Kid. Rana Snare, M.D.    DATE OF BIRTH:  [birth year]   DATE OF PROCEDURE:  10/30/2007  DATE OF DISCHARGE:                               OPERATIVE REPORT   PREOPERATIVE DIAGNOSES:  1. Intrauterine pregnancy at 39 plus weeks.  2. Previous cesarean section, desires repeat.   POSTOPERATIVE DIAGNOSES:  1. Intrauterine pregnancy at 39 plus weeks.  2. Previous cesarean section, desires repeat.   PROCEDURE:  Repeat low-segment transverse cesarean section.   SURGEON:  Dineen Kid. Rana Snare, MD   ANESTHESIA:  Spinal.   INDICATIONS:  [name] is a 45 year old G3, P1 at 19 weeks, previous  cesarean section, desires repeat, previously desires sterilization at  this time she declines.  Risks and benefits of the procedure were  discussed at length.  Informed consent was obtained.   FINDINGS AT THE TIME OF SURGERY:  Viable female infant, Apgars were 8  and 9, pH arterial 7.24, weight is 7 pounds 40 ounces.   DESCRIPTION OF PROCEDURE:  After adequate analgesia, the patient was  placed in the supine position in left lateral tilt.  She was sterilely  prepped and draped.  Bladder sterilely drained with a Foley catheter.  The previous Pfannenstiel skin incision was sharply excised.  Using the  scalpel, it was taken down sharply to the fascia, which was then incised  transversely, extended superiorly and inferiorly off the pelvis rectus  muscle, which was separated sharply in the midline.  Peritoneum was  entered sharply.  Bladder flap created and placed behind the bladder  blade.  A low-segment myotomy incision was made down to the amniotic  sac, extended laterally with the operator's fingertips.  The infant's  vertex was delivered using a vacuum extractor.  The nares and pharynx  were then suctioned, infant was  delivered, cord clamped, cut, and handed  off to the pediatrician for resuscitation.  Cord blood was obtained.  Placenta extracted manually.  The uterus was exteriorized and wiped  clean with a dry lap.  The myotomy incision closed in two layers, first  being a running locking layer, second being imbricating layer of 0  Monocryl suture.  The uterus placed back into peritoneal cavity, and  after copious amount of irrigation adequate hemostasis was assured.  The  peritoneum was closed with 0 Monocryl suture.  Rectus muscle was  plicated in the midline.  Irrigation was applied after adequate  hemostasis.  The fascia was closed with a #1 Vicryl in a running  fashion.  The  Scarpa fascia was reapproximated with a 2-0 plain suture.  The skin was  then stapled, Steri-Strips applied.  The patient tolerated procedure  well, was stable on transfer to recovery room.  Sponge and instruments  count were normal x3.  Estimated blood loss 600 mL.  The patient  received 1 g of cefotetan preoperatively.      Dineen Kid Rana Snare, M.D.  Electronically Signed  DCL/MEDQ  D:  10/30/2007  T:  10/30/2007  Job:  [id number]

## 2010-05-30 NOTE — H&P (Signed)
[name], Ellen Hayden            ACCOUNT NO.:  [id number]   MEDICAL RECORD NO.:  [id number]          PATIENT TYPE:  AMB   LOCATION:  [facility]C                           [facility]   PHYSICIAN:  Dineen Kid. Rana Snare, M.D.    DATE OF BIRTH:  1965-06-05   DATE OF ADMISSION:  09/21/2006  DATE OF DISCHARGE:                              HISTORY & PHYSICAL   HPI:  Ms. Ellen Hayden is a 45 year old G2, P1, who was seen yesterday in the  office for abnormal bleeding.  She is currently 9 weeks 5 days  gestational age, began having vaginal spotting and bleeding.  Ultrasound  revealed an intrauterine fetal demise with a fetal sac and fetal pole of  six weeks.  Previously had had eight-week ultrasound with fetal heart  activity.  Patient presents for dilation and evacuation.   Her blood type is A-positive, her hemoglobin is stable at 12.4.   PAST MEDICAL HISTORY:  Ms. Ellen Hayden past medical history is significant  for history of iron deficiency anemia.   PAST SURGICAL HISTORY:  Cesarean section and inguinal hernia and eye  surgery.   MEDICATIONS:  Prenatal vitamins.   ALLERGIES:  She has no known drug allergies.   PHYSICAL EXAM:  Her blood pressure is 108/60.  HEART:  Regular rate and rhythm.  LUNGS:  Clear to auscultation bilaterally.  ABDOMEN:  Nondistended, nontender.  Uterus is six to eight-week size.  Cervix is closed.   IMPRESSION AND PLAN:  Embryonic demise at nine and a half weeks  gestation with a six and a half week fetal sac.  Patient was offered  expectant management, versus D and E.  She elects for D and E.  The  risks and benefits were discussed at length.  Informed consent was  obtained.      Dineen Kid Rana Snare, M.D.  Electronically Signed     DCL/MEDQ  D:  09/20/2006  T:  09/20/2006  Job:  [id number]

## 2010-06-02 NOTE — H&P (Signed)
NAMEGENIENE, LIST            ACCOUNT NO.:  [id number]   MEDICAL RECORD NO.:  [id number]          PATIENT TYPE:  INP   LOCATION:  9167                          [facility]   PHYSICIAN:  Timothy P. Fontaine, M.D.DATE OF BIRTH:  1965-07-12   DATE OF ADMISSION:  03/06/2005  DATE OF DISCHARGE:                                HISTORY & PHYSICAL   CHIEF COMPLAINT:  1.  Pregnancy at term.  2.  Labor.  3.  Fetal macrosomia suspect.   HISTORY OF PRESENT ILLNESS:  A 45 year old G1, P0 female at [redacted] weeks  gestation who enters with a 2-day history of prodromal labor, now  contracting every 2-4 minutes. The patient was suspected to have large  fetus, underwent ultrasound with an estimated fetal weight at 4100-4200 g.  Prenatal course has been uncomplicated to date with a negative beta strep  culture. For the remainder of her history, see her [facility].   PHYSICAL EXAMINATION:  VITAL SIGNS:  Afebrile, vital signs stable.  HEENT:  Normal.  LUNGS:  Clear.  CARDIAC:  Regular rate without rubs, murmurs, or gallops.  ABDOMEN:  Gravid, vertex fetus. External monitors show reactive fetal  tracing with contractions every 2-4 minutes.  PELVIC:  Vertex, unengaged, high. Cervix is closed at approximately 5-%  effacement.   ASSESSMENT AND PLAN:  A 45 year old gravida 1, para 0 female, [redacted] weeks  gestation, contractions every 2-4 minutes, reactive fetal tracing, unengaged  vertex, estimated fetal weight at 4100-4200 g. Discussed the situation with  the patient and her husband. She has been contracting over the last day to  two days and the issue as to whether there is an obstructed outlet with  relative CPD was reviewed with them. The options of continued expectant  management with attempt at vaginal delivery versus cesarean section now were  reviewed and the risks/benefits of each course of action including failed  trial of vaginal birth with subsequent C-section, shoulder dystocia, versus  the  complications of a cesarean section were reviewed with them and they  ultimately decided to proceed with a cesarean section. The expected  intraoperative/postoperative courses were reviewed. The risks of bleeding,  transfusion, infection, prolonged antibiotics, wound complications, open and  draining of incisions, closure by secondary intention was all discussed,  understood and accepted. The risk of inadvertant injury to internal organs  including bowel, bladder, ureters, vessels, and nerves necessitating major  exploratory reparative surgeries and future reparative surgeries including  ostomy formation was all discussed, understood, and accepted. The risk of  fetal injury  during the birthing process including musculoskeletal, neural, as well as  scalpel injuries were all discussed, understood, and accepted. The patient's  questions were answered to her satisfaction and she is ready to proceed with  surgery.      Timothy P. Fontaine, M.D.  Electronically Signed     TPF/MEDQ  D:  03/06/2005  T:  03/06/2005  Job:  [id number]

## 2010-06-02 NOTE — Discharge Summary (Signed)
[name], Ellen Hayden            ACCOUNT NO.:  [id number]   MEDICAL RECORD NO.:  [id number]          PATIENT TYPE:  INP   LOCATION:  9132                          [facility]   PHYSICIAN:  Timothy P. Fontaine, M.D.DATE OF BIRTH:  20-May-1965   DATE OF ADMISSION:  03/06/2005  DATE OF DISCHARGE:                                 DISCHARGE SUMMARY   DISCHARGE DIAGNOSES:  1.  Pregnancy at term.  2.  Labor.  3.  Macrosomia   PROCEDURE:  Primary low transverse cervical cesarean section, March 06, 2005.   HOSPITAL COURSE:  The patient was admitted in early labor at term. She was  suspected antepartum to have a large fetus and on evaluation cervix was  found to be unfavorable, vertex unengaged, and the estimated fetal weight  was performed which showed a 4200 g fetus. The options for continued labor  versus cesarean section were reviewed and the patient elected for a cesarean  section. The patient underwent an uncomplicated primary low transverse  cervical cesarean section March 06, 2005, producing a normal female  infant, Apgars 9 and 9, weight 9 pounds 4 ounces. The patient's  postoperative course was uncomplicated and she was discharged on  postoperative day #3 ambulating well, tolerating a regular diet, with a  postoperative hemoglobin of 10.0. The patient's blood type is A positive,  her rubella titer is positive. The patient received precautions,  instructions and follow-up, will be seen in the office in 6 weeks, received  a prescription for Tylox #30 one to two p.o. q.4-6h. p.r.n. pain.      Timothy P. Fontaine, M.D.  Electronically Signed     TPF/MEDQ  D:  03/09/2005  T:  03/09/2005  Job:  [id number]

## 2010-06-02 NOTE — Discharge Summary (Signed)
Ellen Hayden, Ellen Hayden            ACCOUNT NO.:  [id number]   MEDICAL RECORD NO.:  [id number]          PATIENT TYPE:  INP   LOCATION:  9134                          [facility]   PHYSICIAN:  Michelle L. Grewal, M.D.DATE OF BIRTH:  [birth year]   DATE OF ADMISSION:  10/30/2007  DATE OF DISCHARGE:  11/02/2007                               DISCHARGE SUMMARY   ADMITTING DIAGNOSES:  1. Intrauterine pregnancy at 5 plus weeks estimated gestational age.  2. Previous caesarian section, desires repeat.   DISCHARGE DIAGNOSES:  1. Status post low-transverse caesarian section.  2. Viable female infant.   PROCEDURE:  Repeat low-transverse caesarian section.   REASON FOR ADMISSION:  Please see written H and P.   HOSPITAL COURSE:  The patient is a 45 year old gravida 3, para 1, that  was admitted to [facility] at 30 weeks' estimated  gestational age for scheduled cesarean section.  The patient had a  previously desired sterilization at the time of her delivery.  However,  on the day of admission, the patient declined procedure.  The patient  was then transferred to the operating room where spinal anesthesia was  administered without difficulty.  A low-transverse incision was made  with delivery of a viable female infant weighing 7 pounds 4 ounces with  Apgars of 8 at one minute and 9 at five minutes.  Arterial cord pH was  7.24.  The patient tolerated the procedure well and was taken to the  recovery room in stable condition.  On postoperative day #1, the patient  was without complaints.  Vitals signs were stable.  She was afebrile.  Abdomen soft.  Fundus firm and nontender.  Abdominal dressing noted to  be clean, dry, and intact.  Laboratory findings revealed hemoglobin of  9.9.  On postoperative day #2, the patient was without complaints.  Vitals signs were stable.  She was afebrile.  Fundus firm and nontender.  Abdominal dressing had been removed revealing the incision that  was  clean, dry, and intact.  The patient was ambulating well and tolerating  regular diet without complaints of nausea and vomiting.  On  postoperative day #3, the patient was without complaints.  Vital signs  were stable.  She was afebrile.  Urine output was good.  Incision was  clean, dry, and intact.  Staples were removed, and the patient was later  discharged home.   CONDITION ON DISCHARGE:  Stable.   DIET:  Regular as tolerated.   ACTIVITY:  No heavy lifting.  No driving x2 weeks.  No vaginal entry.   FOLLOWUP:  The patient to follow up in the office in 1-2 weeks for an  incision check.  She is to call for temperature greater than 100  degrees, persistent nausea, vomiting, heavy vaginal bleeding, and/or  redness, or drainage from the incisional site.   DISCHARGE MEDICATIONS:  1. Tylox #30 one p.o. every 4-6 hours p.r.n.  2. Motrin 600 mg every 6 hours p.r.n.  3. Prenatal vitamins 1 p.o. daily.  4. Colace 1 p.o. daily p.r.n.      Julio Sicks, N.P.  Michelle L. Vincente Poli, M.D.  Electronically Signed    CC/MEDQ  D:  11/25/2007  T:  11/25/2007  Job:  [id number]

## 2010-06-02 NOTE — Op Note (Signed)
Ellen Hayden, GISH            ACCOUNT NO.:  [id number]   MEDICAL RECORD NO.:  [id number]          PATIENT TYPE:  INP   LOCATION:  9132                          FACILITY:  [facility]   PHYSICIAN:  Timothy P. Fontaine, M.D.DATE OF BIRTH:  1965-07-01   DATE OF PROCEDURE:  03/06/2005  DATE OF DISCHARGE:                                 OPERATIVE REPORT   PREOPERATIVE DIAGNOSES:  Pregnancy at term, fetal macrosomia, labor.   POSTOPERATIVE DIAGNOSES:  Pregnancy at term, fetal macrosomia, labor.   PROCEDURE:  Primary low transverse cervical cesarean section.   SURGEON:  Dr. Audie Box   ASSISTANT:  Scrub technician.   ANESTHETIC:  Spinal.   ESTIMATED BLOOD LOSS:  Less than 500 mL.   COMPLICATIONS:  None.   SPECIMEN:  1.  Samples of cord blood  2.  Cord blood banking  3.  Placenta, umbilical cord.   FINDINGS:  At 67 normal female, Apgars 09/09, weight 9 pounds 4 ounces.  Pelvic anatomy noted to be normal.   PROCEDURE:  The patient was taken to the operating room, underwent spinal  anesthesia was placed left tilt supine position, received abdominal  preparation with Betadine solution. Bladder was emptied with indwelling  Foley catheterization placed in sterile technique. The patient was draped in  usual fashion and after assuring adequate anesthesia the abdomen sharply  entered through a Pfannenstiel incision achieving adequate hemostasis at all  levels. The bladder flap was sharply bluntly developed without difficulty  and the uterus was sharply entered in the lower uterine segment bluntly  extended laterally. The bulging membranes were ruptured. The fluid noted to  be clear. The infant's head delivered through the incision. The nares and  mouth suctioned, the rest of the infant delivered. The cord doubly clamped  and cut and the infant was handed to pediatrics in attendance. Samples of  cord blood was obtained. Placenta was then spontaneously extruded and passed  off for cord  blood banking retrieval. The uterus was exteriorized.  Endometrial cavity explored with the sponge to remove all placental membrane  fragments. The patient received 1 gram Ancef antibiotic prophylaxis at this  time. The uterine incision was closed in two layers using 0 Vicryl suture,  first a running interlocking stitch followed by imbricating stitch. The  uterus was returned to the abdomen which was copiously irrigated. Adequate  hemostasis visualized. Anterior fascia reapproximated using 0  Vicryl suture running stitch. Subcutaneous tissues irrigated. Hemostasis  achieved with electrocautery. The skin reapproximated using 4-0 Vicryl  running subcuticular stitch. Steri-Strips, Benzoin applied. Sterile dressing  applied. The patient taken to recovery room in good condition having  tolerated the procedure well.      Timothy P. Fontaine, M.D.  Electronically Signed     TPF/MEDQ  D:  03/06/2005  T:  03/07/2005  Job:  [id number]

## 2010-09-13 ENCOUNTER — Other Ambulatory Visit: Payer: Self-pay | Admitting: Gynecology

## 2010-09-13 DIAGNOSIS — Z1231 Encounter for screening mammogram for malignant neoplasm of breast: Secondary | ICD-10-CM

## 2010-10-08 IMAGING — MG MM SCREEN MAMMOGRAM BILATERAL
4 series · 4 of 4 positions shown · non-contrast
Comparison: Prior studies.

DG SCREEN MAMMOGRAM BILATERAL
Bilateral CC and MLO view(s) were taken.
Prior study comparison: June 25, 2006, DG screen mammogram bilateral.

DIGITAL SCREENING MAMMOGRAM WITH CAD:

[R CC]
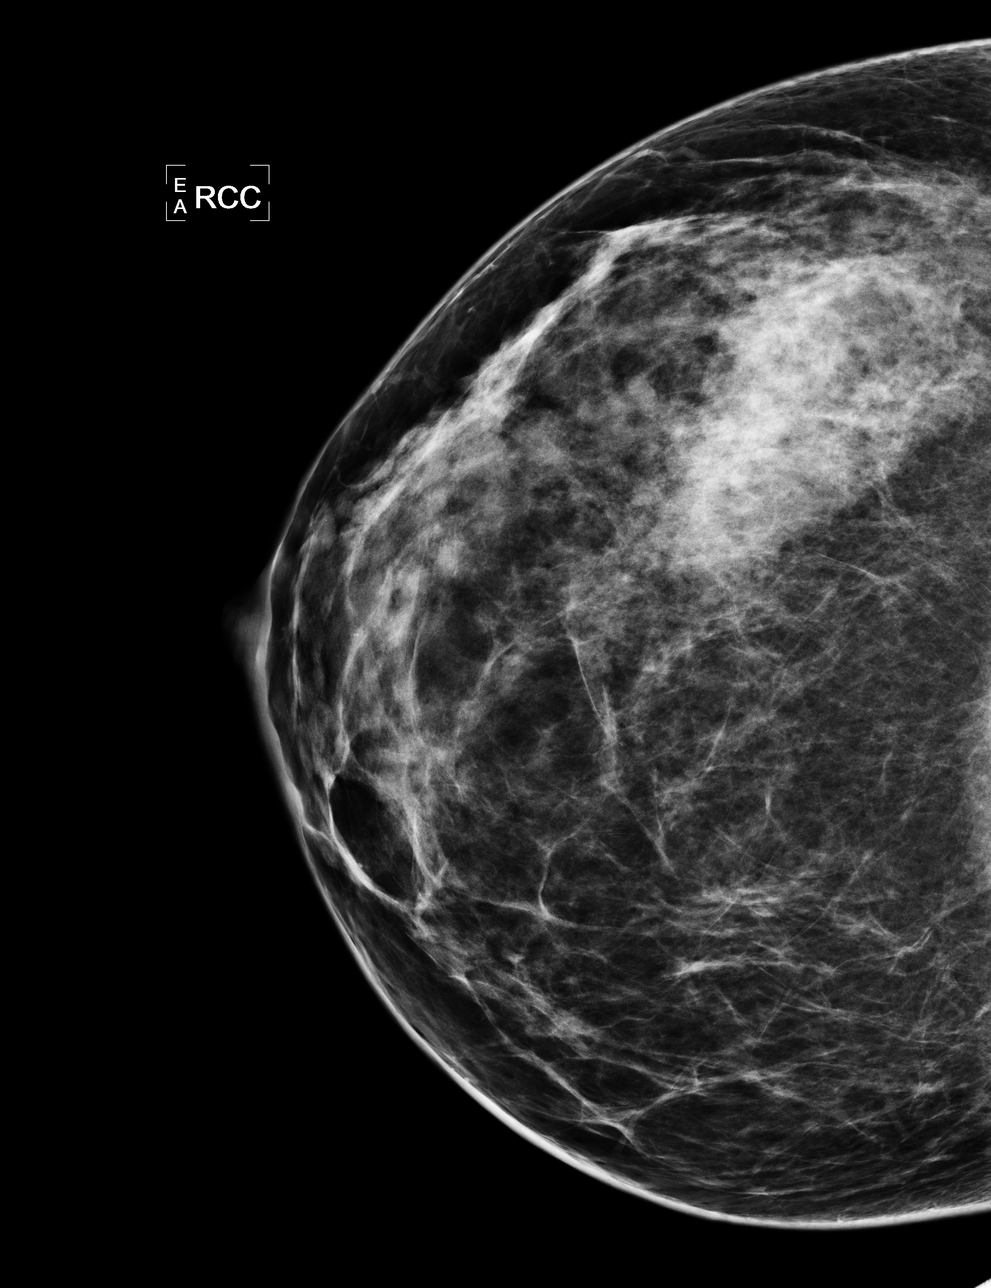

[L CC]
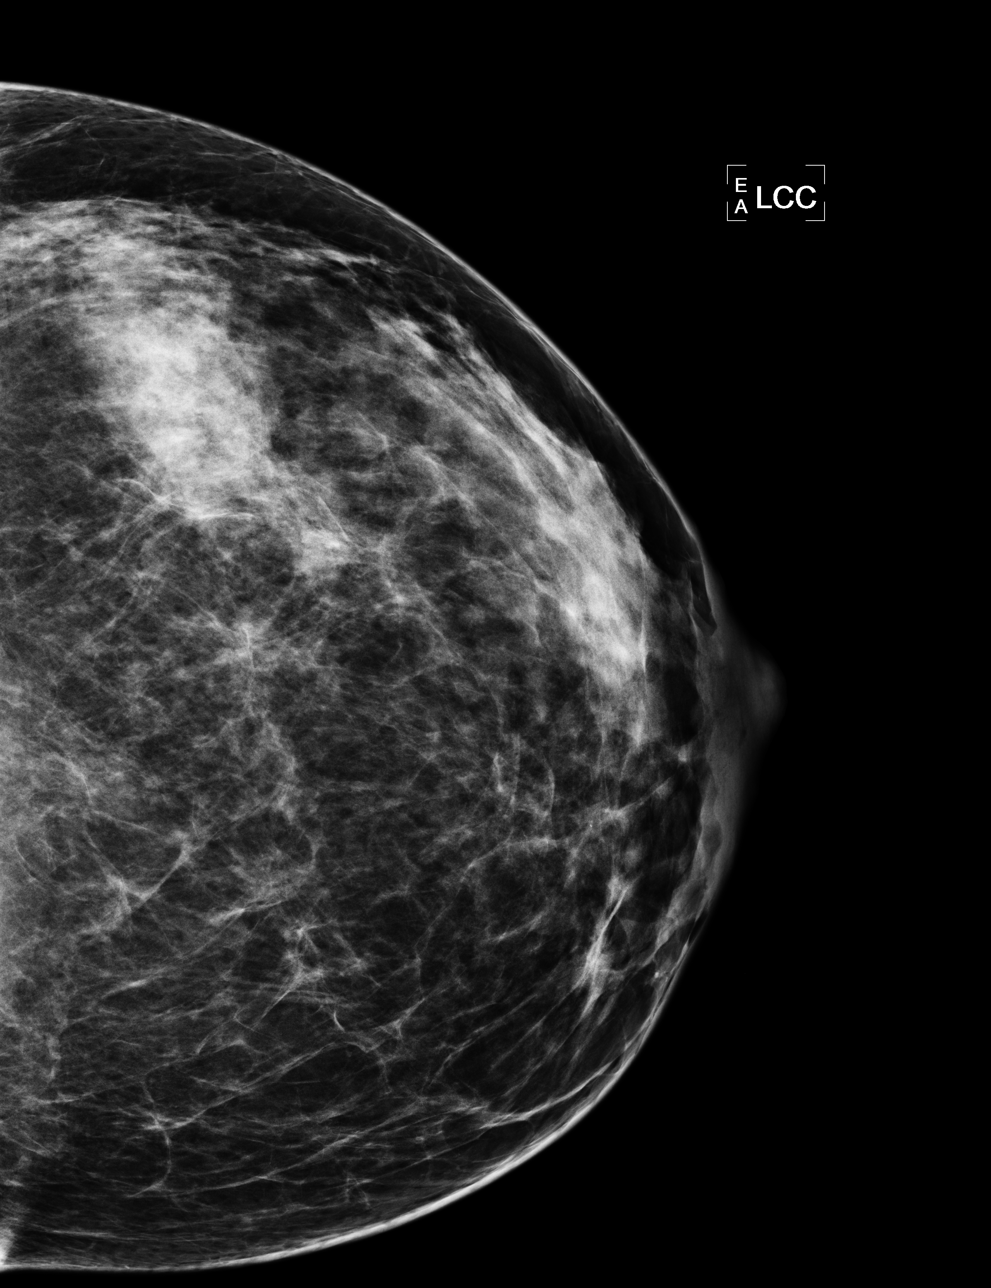

[L MLO]
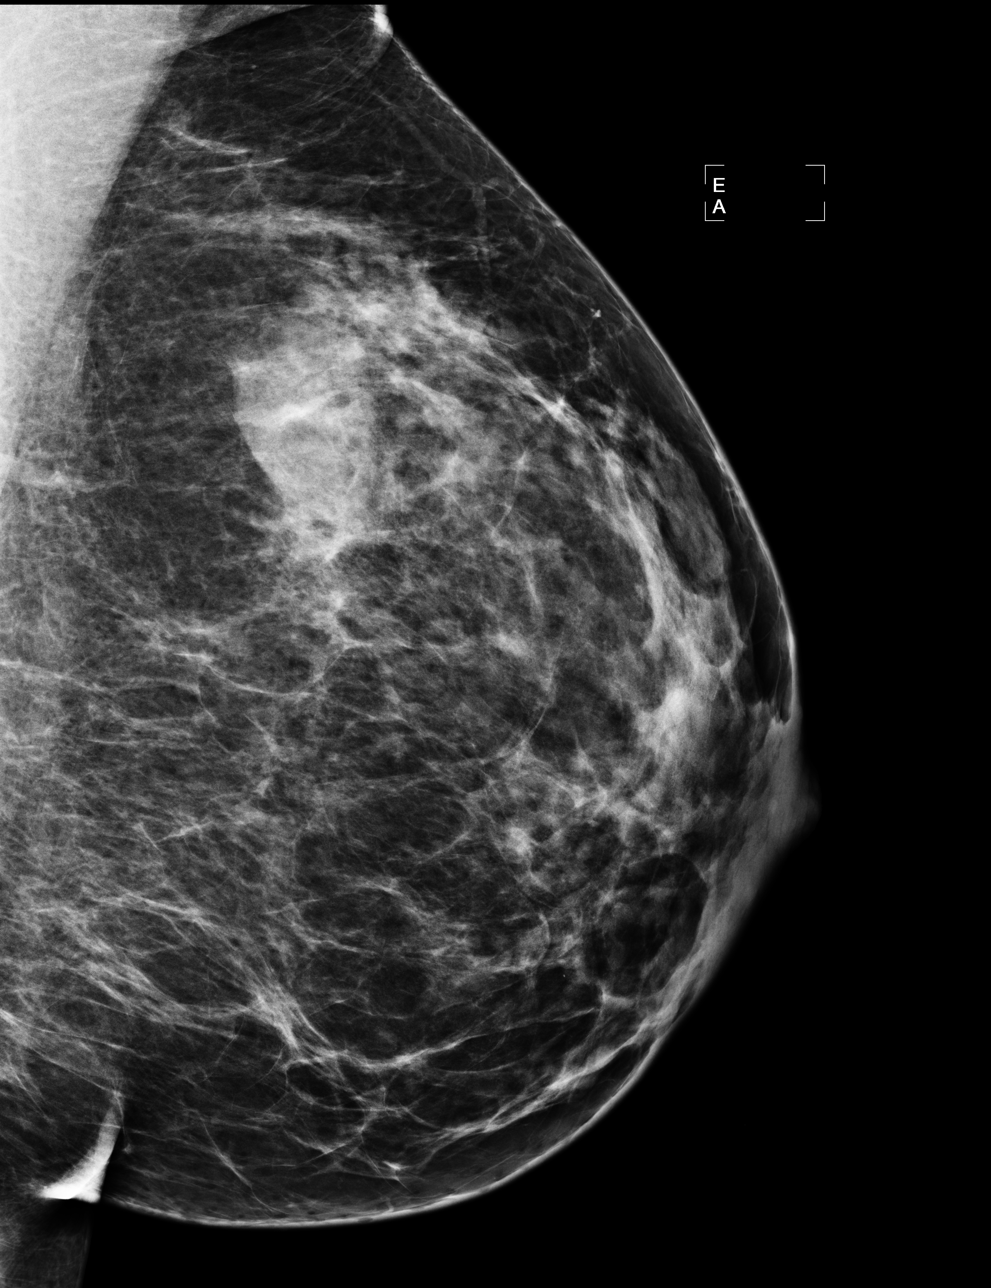

[R MLO]
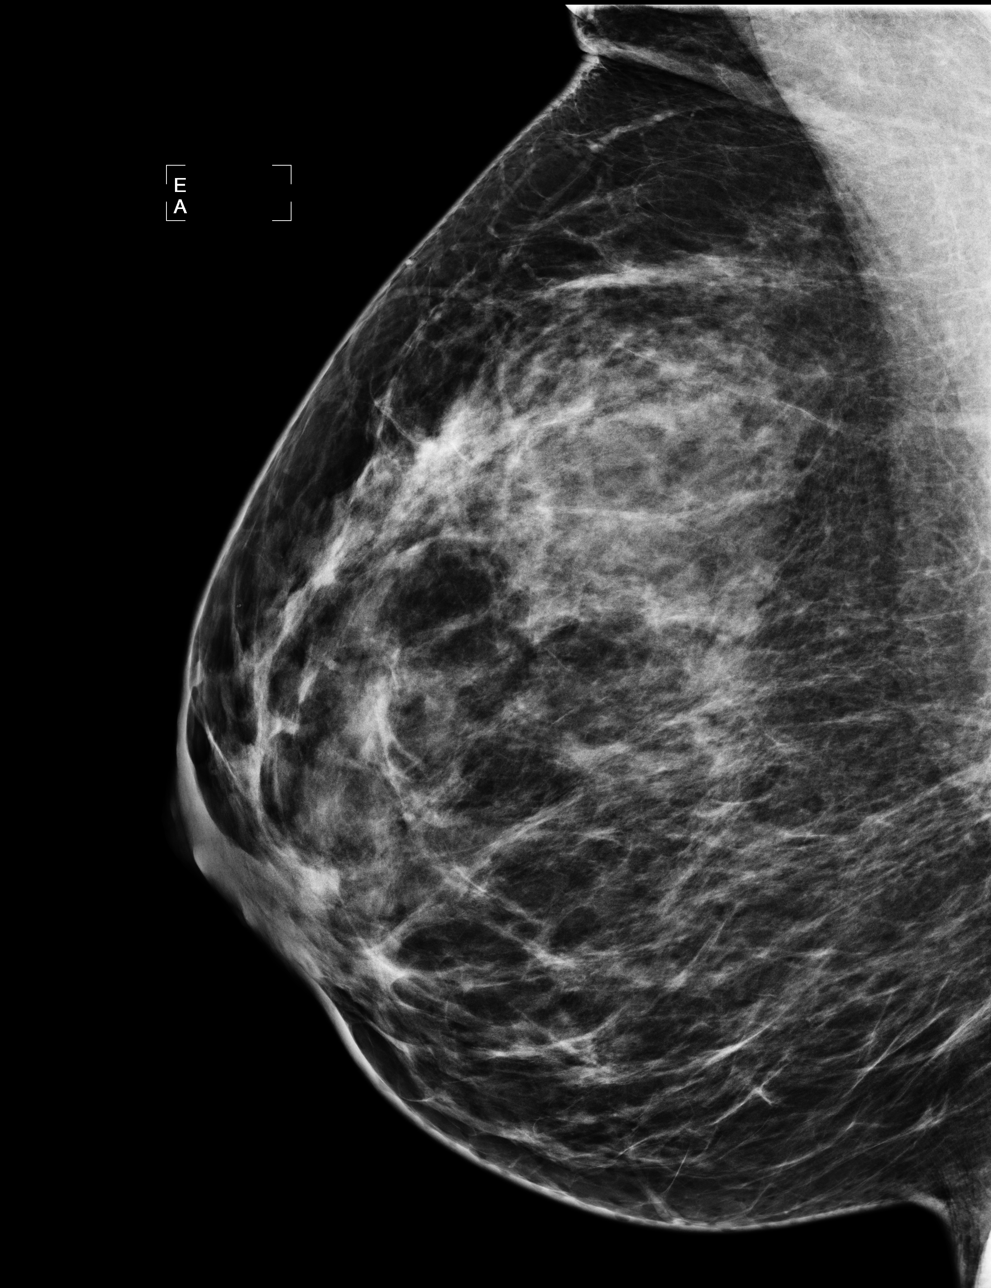

[4 of 4 positions shown; findings below may reference images not displayed]

There are scattered fibroglandular densities.  A possible mass is noted in the left breast.  Spot 
compression views and possibly sonography are recommended for further evaluation.  The right breast
is unremarkable.

Images were processed with CAD.
IMPRESSION: Possible mass, left breast.  Additional evaluation is indicated.  The patient will be contacted for
additional studies and a supplemental report will follow.

ASSESSMENT: Need additional imaging evaluation and/or prior mammograms for comparison - BI-RADS 0 -
Left

Further imaging of the left breast.
,

## 2010-10-16 LAB — CBC
Hemoglobin: 9.9 — ABNORMAL LOW
MCV: 93.4
Platelets: 206
RBC: 3.07 — ABNORMAL LOW
RBC: 4.18
RDW: 15.1
WBC: 13.4 — ABNORMAL HIGH
WBC: 15.6 — ABNORMAL HIGH

## 2010-10-16 LAB — RPR: RPR Ser Ql: NONREACTIVE

## 2010-10-17 ENCOUNTER — Ambulatory Visit (HOSPITAL_COMMUNITY)
Admission: RE | Admit: 2010-10-17 | Discharge: 2010-10-17 | Disposition: A | Discharge status: Home / Self Care | Payer: Managed Care, Other (non HMO) | Source: Ambulatory Visit | Attending: Gynecology | Admitting: Gynecology

## 2010-10-17 DIAGNOSIS — Z1231 Encounter for screening mammogram for malignant neoplasm of breast: Secondary | ICD-10-CM | POA: Insufficient documentation

## 2010-10-27 LAB — CBC
Hemoglobin: 11.2 — ABNORMAL LOW
MCV: 90.1
RBC: 3.64 — ABNORMAL LOW
WBC: 15.3 — ABNORMAL HIGH

## 2010-12-27 ENCOUNTER — Ambulatory Visit (INDEPENDENT_AMBULATORY_CARE_PROVIDER_SITE_OTHER): Payer: Managed Care, Other (non HMO) | Admitting: Gynecology

## 2010-12-27 ENCOUNTER — Encounter: Payer: Self-pay | Admitting: Gynecology

## 2010-12-27 VITALS — BP 118/68 | Ht 63.5 in | Wt 142.5 lb

## 2010-12-27 DIAGNOSIS — Z131 Encounter for screening for diabetes mellitus: Secondary | ICD-10-CM

## 2010-12-27 DIAGNOSIS — N926 Irregular menstruation, unspecified: Secondary | ICD-10-CM

## 2010-12-27 DIAGNOSIS — Z01419 Encounter for gynecological examination (general) (routine) without abnormal findings: Secondary | ICD-10-CM

## 2010-12-27 DIAGNOSIS — Z13220 Encounter for screening for lipoid disorders: Secondary | ICD-10-CM

## 2010-12-27 LAB — GLUCOSE, RANDOM: Glucose, Bld: 87 mg/dL (ref 70–99)

## 2010-12-27 NOTE — Patient Instructions (Signed)
Follow up for laboratory studies in reference to your menstrual irregularity. Otherwise follow up in one year for annual GYN exam

## 2010-12-27 NOTE — Progress Notes (Signed)
Ellen Hayden 05-29-1965 [id number]        45 y.o.  for annual exam.  Ashby Dawes periods are somewhat irregular coming every 21-28 days. No intermenstrual bleeding or other symptoms. Vasectomy birth control.  Past medical history,surgical history, medications, allergies, family history and social history were all reviewed and documented in the EPIC chart. ROS:  Was performed and pertinent positives and negatives are included in the history.  Exam: chaperone present Filed Vitals:   12/27/10 1046  BP: 118/68   General appearance  Normal Skin grossly normal Head/Neck normal with no cervical or supraclavicular adenopathy thyroid normal Lungs  clear Cardiac RR, without RMG Abdominal  soft, nontender, without masses, organomegaly or hernia Breasts  examined lying and sitting without masses, retractions, discharge or axillary adenopathy. Pelvic  Ext/BUS/vagina  normal   Cervix  normal    Uterus  anteverted, normal size, shape and contour, midline and mobile nontender   Adnexa  Without masses or tenderness    Anus and perineum  normal   Rectovaginal  normal sphincter tone without palpated masses or tenderness.    Assessment/Plan:  45 y.o. female for annual exam.    1. Mild menstrual irregularity. This is probably age related. We'll check baseline TSH FSH prolactin. Options for management assuming these are normal was discussed included observation low-dose oral contraceptives Mirena IUD. Patient is not interested in intervention at this time we'll follow for her lab studies. 2. Breast health. SBE monthly reviewed. Just had mammogram in October we'll continue with annual mammography. 3. Pap smears. Patient has no history of abnormal Pap smears in the past with numerous normal reports in the chart her last Pap smear 2011. I discussed current screening guidelines with less frequent intervals and we'll plan on every three-year Pap smears and I did not do one today and she agrees with  this. 4. Health maintenance. Assuming she continues well from a gynecologic standpoint she will see me in a year sooner as needed.    Ellen Lords MD, 11:59 AM 12/27/2010

## 2011-04-18 ENCOUNTER — Telehealth: Payer: Self-pay | Admitting: *Deleted

## 2011-04-18 MED ORDER — NORETHINDRONE ACET-ETHINYL EST 1-20 MG-MCG PO TABS: 1.0000 | ORAL_TABLET | Freq: Every day | ORAL | Status: DC

## 2011-04-18 NOTE — Telephone Encounter (Signed)
Pt informed with the below note. 

## 2011-04-18 NOTE — Telephone Encounter (Signed)
Okay for low-dose oral contraceptive to delayed menses. In patients who do not smoke cigarettes and otherwise healthy without blood pressure/diabetes risk low. Main risk would be like lots such as stroke heart attack DVT. On this patient assumes this risk than I prescribed 2 packs.

## 2011-04-18 NOTE — Telephone Encounter (Signed)
Pt called wanting to start on birth control pill  to stop her cycle from starting. Pt is running in a marathon on April 28 th and she is expecting her period to start according to her irregular cycle that comes every 21-28 days. Please advise

## 2012-03-11 ENCOUNTER — Encounter: Payer: Self-pay | Admitting: Gynecology

## 2012-03-11 ENCOUNTER — Other Ambulatory Visit (HOSPITAL_COMMUNITY)
Admission: RE | Admit: 2012-03-11 | Discharge: 2012-03-11 | Disposition: A | Discharge status: Home / Self Care | Payer: Managed Care, Other (non HMO) | Source: Ambulatory Visit | Attending: Gynecology | Admitting: Gynecology

## 2012-03-11 ENCOUNTER — Ambulatory Visit (INDEPENDENT_AMBULATORY_CARE_PROVIDER_SITE_OTHER): Payer: Managed Care, Other (non HMO) | Admitting: Gynecology

## 2012-03-11 VITALS — BP 114/64 | Ht 64.0 in | Wt 145.0 lb

## 2012-03-11 DIAGNOSIS — Z01419 Encounter for gynecological examination (general) (routine) without abnormal findings: Secondary | ICD-10-CM | POA: Insufficient documentation

## 2012-03-11 DIAGNOSIS — N926 Irregular menstruation, unspecified: Secondary | ICD-10-CM

## 2012-03-11 DIAGNOSIS — Z13220 Encounter for screening for lipoid disorders: Secondary | ICD-10-CM

## 2012-03-11 DIAGNOSIS — N879 Dysplasia of cervix uteri, unspecified: Secondary | ICD-10-CM | POA: Insufficient documentation

## 2012-03-11 DIAGNOSIS — Z1151 Encounter for screening for human papillomavirus (HPV): Secondary | ICD-10-CM | POA: Insufficient documentation

## 2012-03-11 LAB — COMPREHENSIVE METABOLIC PANEL
Albumin: 4.4 g/dL (ref 3.5–5.2)
CO2: 27 mEq/L (ref 19–32)
Glucose, Bld: 70 mg/dL (ref 70–99)
Potassium: 4 mEq/L (ref 3.5–5.3)
Sodium: 138 mEq/L (ref 135–145)
Total Protein: 6.8 g/dL (ref 6.0–8.3)

## 2012-03-11 LAB — LIPID PANEL
Cholesterol: 133 mg/dL (ref 0–200)
Triglycerides: 75 mg/dL (ref <150)

## 2012-03-11 LAB — CBC WITH DIFFERENTIAL/PLATELET
Basophils Relative: 0 % (ref 0–1)
HCT: 36.9 % (ref 36.0–46.0)
Hemoglobin: 12.3 g/dL (ref 12.0–15.0)
Lymphocytes Relative: 41 % (ref 12–46)
MCHC: 33.3 g/dL (ref 30.0–36.0)
MCV: 91.6 fL (ref 78.0–100.0)
Monocytes Absolute: 0.6 10*3/uL (ref 0.1–1.0)
Monocytes Relative: 7 % (ref 3–12)
Neutro Abs: 4.7 10*3/uL (ref 1.7–7.7)

## 2012-03-11 NOTE — Patient Instructions (Signed)
Follow up in one year for annual exam. Sooner if significant menstrual irregularity or other issues.

## 2012-03-11 NOTE — Progress Notes (Signed)
Ellen Hayden 15-Feb-1965 [id number]        47 y.o.  [id number] for annual exam.  Doing well.  Past medical history,surgical history, medications, allergies, family history and social history were all reviewed and documented in the EPIC chart. ROS:  Was performed and pertinent positives and negatives are included in the history.  Exam: Kim assistant Filed Vitals:   03/11/12 1358  BP: 114/64  Height: 5\' 4"  (1.626 m)  Weight: 145 lb (65.772 kg)   General appearance  Normal Skin grossly normal Head/Neck normal with no cervical or supraclavicular adenopathy thyroid normal Lungs  clear Cardiac RR, without RMG Abdominal  soft, nontender, without masses, organomegaly or hernia Breasts  examined lying and sitting without masses, retractions, discharge or axillary adenopathy. Pelvic  Ext/BUS/vagina  normal   Cervix  normal Pap/HPV  Uterus  anteverted, normal size, shape and contour, midline and mobile nontender   Adnexa  Without masses or tenderness    Anus and perineum  normal   Rectovaginal  normal sphincter tone without palpated masses or tenderness.    Assessment/Plan:  47 y.o. [id number] female for annual exam, vasectomy birth control.   1. Mild menstrual irregularity. Patient's menses ranged generally between 25 and 27 days. Had blood work done 2 years ago to include normal TSH and FSH with a marginally elevated FSH. We'll recheck [facility] today. Not having any other symptoms such as hot flushes night sweats. We'll continue to monitor as long as her monthly will follow if she has significant intermenstrual bleeding or skips and she'll represent for evaluation. 2. Mammography overdo. Patient knows to schedule and agrees to do so. SBE monthly reviewed. 3. Pap smear 2011. Pap/HPV today. History of mild dysplasia approximately 2000 with normal Pap smears since than without treatment. Assuming negative and plan every five-year screening. 4. Health maintenance. Baseline CBC comprehensive metabolic  panel lipid profile urinalysis ordered. Follow up one year, sooner as needed.    Dara Lords MD, 2:27 PM 03/11/2012

## 2012-03-12 LAB — URINALYSIS W MICROSCOPIC + REFLEX CULTURE
Bacteria, UA: NONE SEEN
Bilirubin Urine: NEGATIVE
Casts: NONE SEEN
Crystals: NONE SEEN
Ketones, ur: NEGATIVE mg/dL
Specific Gravity, Urine: 1.014 (ref 1.005–1.030)
pH: 7.5 (ref 5.0–8.0)

## 2012-03-20 ENCOUNTER — Other Ambulatory Visit: Payer: Self-pay

## 2012-03-20 DIAGNOSIS — Z1231 Encounter for screening mammogram for malignant neoplasm of breast: Secondary | ICD-10-CM

## 2012-04-17 ENCOUNTER — Ambulatory Visit
Admission: RE | Admit: 2012-04-17 | Discharge: 2012-04-17 | Disposition: A | Discharge status: Home / Self Care | Payer: Managed Care, Other (non HMO) | Source: Ambulatory Visit

## 2012-04-17 ENCOUNTER — Other Ambulatory Visit: Payer: Self-pay

## 2012-04-17 DIAGNOSIS — Z1231 Encounter for screening mammogram for malignant neoplasm of breast: Secondary | ICD-10-CM

## 2012-11-20 ENCOUNTER — Other Ambulatory Visit: Payer: Self-pay

## 2013-04-20 ENCOUNTER — Ambulatory Visit
Admission: RE | Admit: 2013-04-20 | Discharge: 2013-04-20 | Disposition: A | Discharge status: Home / Self Care | Payer: Managed Care, Other (non HMO) | Source: Ambulatory Visit

## 2013-04-20 ENCOUNTER — Other Ambulatory Visit: Payer: Self-pay

## 2013-04-20 DIAGNOSIS — Z1231 Encounter for screening mammogram for malignant neoplasm of breast: Secondary | ICD-10-CM

## 2013-06-25 ENCOUNTER — Encounter: Payer: Self-pay | Admitting: Gynecology

## 2013-06-25 ENCOUNTER — Ambulatory Visit (INDEPENDENT_AMBULATORY_CARE_PROVIDER_SITE_OTHER): Payer: Managed Care, Other (non HMO) | Admitting: Gynecology

## 2013-06-25 VITALS — BP 116/74 | Ht 63.0 in | Wt 149.0 lb

## 2013-06-25 DIAGNOSIS — Z01419 Encounter for gynecological examination (general) (routine) without abnormal findings: Secondary | ICD-10-CM

## 2013-06-25 DIAGNOSIS — N926 Irregular menstruation, unspecified: Secondary | ICD-10-CM

## 2013-06-25 LAB — CBC WITH DIFFERENTIAL/PLATELET
BASOS ABS: 0 10*3/uL (ref 0.0–0.1)
BASOS PCT: 0 % (ref 0–1)
EOS PCT: 1 % (ref 0–5)
Eosinophils Absolute: 0.1 10*3/uL (ref 0.0–0.7)
HCT: 37.3 % (ref 36.0–46.0)
Hemoglobin: 12.5 g/dL (ref 12.0–15.0)
Lymphocytes Relative: 26 % (ref 12–46)
Lymphs Abs: 2.6 10*3/uL (ref 0.7–4.0)
MCH: 29.7 pg (ref 26.0–34.0)
MCHC: 33.5 g/dL (ref 30.0–36.0)
MCV: 88.6 fL (ref 78.0–100.0)
MONO ABS: 0.8 10*3/uL (ref 0.1–1.0)
Monocytes Relative: 8 % (ref 3–12)
Neutro Abs: 6.4 10*3/uL (ref 1.7–7.7)
Neutrophils Relative %: 65 % (ref 43–77)
PLATELETS: 246 10*3/uL (ref 150–400)
RBC: 4.21 MIL/uL (ref 3.87–5.11)
RDW: 14.7 % (ref 11.5–15.5)
WBC: 9.9 10*3/uL (ref 4.0–10.5)

## 2013-06-25 MED ORDER — NORETHINDRONE ACET-ETHINYL EST 1-20 MG-MCG PO TABS: 1.0000 | ORAL_TABLET | Freq: Every day | ORAL | Status: DC

## 2013-06-25 NOTE — Patient Instructions (Signed)
Start on the birth control pills as we discussed. Call me if you have any issues with this.  You may obtain a copy of any labs that were done today by logging onto MyChart as outlined in the instructions provided with your AVS (after visit summary). The office will not call with normal lab results but certainly if there are any significant abnormalities then we will contact you.   Health Maintenance, Female A healthy lifestyle and preventative care can promote health and wellness.  Maintain regular health, dental, and eye exams.  Eat a healthy diet. Foods like vegetables, fruits, whole grains, low-fat dairy products, and lean protein foods contain the nutrients you need without too many calories. Decrease your intake of foods high in solid fats, added sugars, and salt. Get information about a proper diet from your caregiver, if necessary.  Regular physical exercise is one of the most important things you can do for your health. Most adults should get at least 150 minutes of moderate-intensity exercise (any activity that increases your heart rate and causes you to sweat) each week. In addition, most adults need muscle-strengthening exercises on 2 or more days a week.   Maintain a healthy weight. The body mass index (BMI) is a screening tool to identify possible weight problems. It provides an estimate of body fat based on height and weight. Your caregiver can help determine your BMI, and can help you achieve or maintain a healthy weight. For adults 20 years and older:  A BMI below 18.5 is considered underweight.  A BMI of 18.5 to 24.9 is normal.  A BMI of 25 to 29.9 is considered overweight.  A BMI of 30 and above is considered obese.  Maintain normal blood lipids and cholesterol by exercising and minimizing your intake of saturated fat. Eat a balanced diet with plenty of fruits and vegetables. Blood tests for lipids and cholesterol should begin at age 20 and be repeated every 5 years. If your  lipid or cholesterol levels are high, you are over 50, or you are a high risk for heart disease, you may need your cholesterol levels checked more frequently.Ongoing high lipid and cholesterol levels should be treated with medicines if diet and exercise are not effective.  If you smoke, find out from your caregiver how to quit. If you do not use tobacco, do not start.  Lung cancer screening is recommended for adults aged 55 80 years who are at high risk for developing lung cancer because of a history of smoking. Yearly low-dose computed tomography (CT) is recommended for people who have at least a 30-pack-year history of smoking and are a current smoker or have quit within the past 15 years. A pack year of smoking is smoking an average of 1 pack of cigarettes a day for 1 year (for example: 1 pack a day for 30 years or 2 packs a day for 15 years). Yearly screening should continue until the smoker has stopped smoking for at least 15 years. Yearly screening should also be stopped for people who develop a health problem that would prevent them from having lung cancer treatment.  If you are pregnant, do not drink alcohol. If you are breastfeeding, be very cautious about drinking alcohol. If you are not pregnant and choose to drink alcohol, do not exceed 1 drink per day. One drink is considered to be 12 ounces (355 mL) of beer, 5 ounces (148 mL) of wine, or 1.5 ounces (44 mL) of liquor.  Avoid use of   street drugs. Do not share needles with anyone. Ask for help if you need support or instructions about stopping the use of drugs.  High blood pressure causes heart disease and increases the risk of stroke. Blood pressure should be checked at least every 1 to 2 years. Ongoing high blood pressure should be treated with medicines, if weight loss and exercise are not effective.  If you are 66 to 47 years old, ask your caregiver if you should take aspirin to prevent strokes.  Diabetes screening involves taking a  blood sample to check your fasting blood sugar level. This should be done once every 3 years, after age 67, if you are within normal weight and without risk factors for diabetes. Testing should be considered at a younger age or be carried out more frequently if you are overweight and have at least 1 risk factor for diabetes.  Breast cancer screening is essential preventative care for women. You should practice "breast self-awareness." This means understanding the normal appearance and feel of your breasts and may include breast self-examination. Any changes detected, no matter how small, should be reported to a caregiver. Women in their 38s and 30s should have a clinical breast exam (CBE) by a caregiver as part of a regular health exam every 1 to 3 years. After age 33, women should have a CBE every year. Starting at age 73, women should consider having a mammogram (breast X-ray) every year. Women who have a family history of breast cancer should talk to their caregiver about genetic screening. Women at a high risk of breast cancer should talk to their caregiver about having an MRI and a mammogram every year.  Breast cancer gene (BRCA)-related cancer risk assessment is recommended for women who have family members with BRCA-related cancers. BRCA-related cancers include breast, ovarian, tubal, and peritoneal cancers. Having family members with these cancers may be associated with an increased risk for harmful changes (mutations) in the breast cancer genes BRCA1 and BRCA2. Results of the assessment will determine the need for genetic counseling and BRCA1 and BRCA2 testing.  The Pap test is a screening test for cervical cancer. Women should have a Pap test starting at age 31. Between ages 23 and 23, Pap tests should be repeated every 2 years. Beginning at age 28, you should have a Pap test every 3 years as long as the past 3 Pap tests have been normal. If you had a hysterectomy for a problem that was not cancer or  a condition that could lead to cancer, then you no longer need Pap tests. If you are between ages 39 and 49, and you have had normal Pap tests going back 10 years, you no longer need Pap tests. If you have had past treatment for cervical cancer or a condition that could lead to cancer, you need Pap tests and screening for cancer for at least 20 years after your treatment. If Pap tests have been discontinued, risk factors (such as a new sexual partner) need to be reassessed to determine if screening should be resumed. Some women have medical problems that increase the chance of getting cervical cancer. In these cases, your caregiver may recommend more frequent screening and Pap tests.  The human papillomavirus (HPV) test is an additional test that may be used for cervical cancer screening. The HPV test looks for the virus that can cause the cell changes on the cervix. The cells collected during the Pap test can be tested for HPV. The HPV test could  be used to screen women aged 62 years and older, and should be used in women of any age who have unclear Pap test results. After the age of 47, women should have HPV testing at the same frequency as a Pap test.  Colorectal cancer can be detected and often prevented. Most routine colorectal cancer screening begins at the age of 80 and continues through age 58. However, your caregiver may recommend screening at an earlier age if you have risk factors for colon cancer. On a yearly basis, your caregiver may provide home test kits to check for hidden blood in the stool. Use of a small camera at the end of a tube, to directly examine the colon (sigmoidoscopy or colonoscopy), can detect the earliest forms of colorectal cancer. Talk to your caregiver about this at age 6, when routine screening begins. Direct examination of the colon should be repeated every 5 to 10 years through age 91, unless early forms of pre-cancerous polyps or small growths are found.  Hepatitis C  blood testing is recommended for all people born from [birth year] through 1964 and any individual with known risks for hepatitis C.  Practice safe sex. Use condoms and avoid high-risk sexual practices to reduce the spread of sexually transmitted infections (STIs). Sexually active women aged 67 and younger should be checked for Chlamydia, which is a common sexually transmitted infection. Older women with new or multiple partners should also be tested for Chlamydia. Testing for other STIs is recommended if you are sexually active and at increased risk.  Osteoporosis is a disease in which the bones lose minerals and strength with aging. This can result in serious bone fractures. The risk of osteoporosis can be identified using a bone density scan. Women ages 76 and over and women at risk for fractures or osteoporosis should discuss screening with their caregivers. Ask your caregiver whether you should be taking a calcium supplement or vitamin D to reduce the rate of osteoporosis.  Menopause can be associated with physical symptoms and risks. Hormone replacement therapy is available to decrease symptoms and risks. You should talk to your caregiver about whether hormone replacement therapy is right for you.  Use sunscreen. Apply sunscreen liberally and repeatedly throughout the day. You should seek shade when your shadow is shorter than you. Protect yourself by wearing long sleeves, pants, a wide-brimmed hat, and sunglasses year round, whenever you are outdoors.  Notify your caregiver of new moles or changes in moles, especially if there is a change in shape or color. Also notify your caregiver if a mole is larger than the size of a pencil eraser.  Stay current with your immunizations. Document Released: 07/17/2010 Document Revised: 04/28/2012 Document Reviewed: 07/17/2010 [facility] Patient Information 2014 [employer].

## 2013-06-25 NOTE — Progress Notes (Signed)
Ellen Hayden 10-11-1965 [id number]        48 y.o.  [id number] for annual exam.  Several issues noted below  Past medical history,surgical history, problem list, medications, allergies, family history and social history were all reviewed and documented as reviewed in the EPIC chart.  ROS:  12 system ROS performed with pertinent positives and negatives included in the history, assessment and plan.  Included Systems: General, HEENT, Neck, Cardiovascular, Pulmonary, Gastrointestinal, Genitourinary, Musculoskeletal, Dermatologic, Endocrine, Hematological, Neurologic, Psychiatric Additional significant findings :  None   Exam: Kim assistant Filed Vitals:   06/25/13 0918  BP: 116/74  Height: 5\' 3"  (1.6 m)  Weight: 149 lb (67.586 kg)   General appearance:  Normal affect, orientation and appearance. Skin: Grossly normal HEENT: Without gross lesions.  No cervical or supraclavicular adenopathy. Thyroid normal.  Lungs:  Clear without wheezing, rales or rhonchi Cardiac: RR, without RMG Abdominal:  Soft, nontender, without masses, guarding, rebound, organomegaly or hernia Breasts:  Examined lying and sitting without masses, retractions, discharge or axillary adenopathy. Pelvic:  Ext/BUS/vagina normal  Cervix normal  Uterus anteverted, normal size, shape and contour, midline and mobile nontender   Adnexa  Without masses or tenderness    Anus and perineum  Normal   Rectovaginal  Normal sphincter tone without palpated masses or tenderness.    Assessment/Plan:  48 y.o. [id number] female for annual exam with irregular menses, vasectomy birth control.   1. Irregular menses. Patient's periods will fluctuate 1-2 weeks early to 1-2 weeks late. Are regular length and flow within the occur. No other symptoms such as hot flushes night sweats hair skin or weight changes. Check baseline labs TSH FSH prolactin.  Do not feel ultrasound at this point needed as most likely ovulatory irregularity. Options for  management reviewed to observation, oral contraceptive regulation or possible Mirena IUD suppression. Patient interested in trying birth control pills. Had used in the past without difficulty. Never smoked and not being follow for any medical issues. Loestrin 120 equivalent prescribed. Every other to every third month withdrawal options reviewed, offbrand labeling. Refill x1 year. Call if any issues. 2. Pap/HPV negative 2014. No Pap smear done today. 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 Plan repeat at 3-5 year interval. 3. Mammography 04/2013. Continue with annual mammography. SBE monthly reviewed. 4. Health maintenance. Baseline CBC comprehensive metabolic panel lipid profile urinalysis ordered with above lab work. Followup one year or sooner if issues.   Note: This document was prepared with digital dictation and possible smart phrase technology. Any transcriptional errors that result from this process are unintentional.   Anastasio Auerbach MD, 9:50 AM 06/25/2013

## 2013-06-26 ENCOUNTER — Encounter: Payer: Self-pay | Admitting: Gynecology

## 2013-06-26 LAB — URINALYSIS W MICROSCOPIC + REFLEX CULTURE
Bacteria, UA: NONE SEEN
Bilirubin Urine: NEGATIVE
Casts: NONE SEEN
Crystals: NONE SEEN
GLUCOSE, UA: NEGATIVE mg/dL
HGB URINE DIPSTICK: NEGATIVE
Ketones, ur: NEGATIVE mg/dL
Leukocytes, UA: NEGATIVE
Nitrite: NEGATIVE
PROTEIN: NEGATIVE mg/dL
Specific Gravity, Urine: 1.026 (ref 1.005–1.030)
Squamous Epithelial / LPF: NONE SEEN
Urobilinogen, UA: 0.2 mg/dL (ref 0.0–1.0)
pH: 7 (ref 5.0–8.0)

## 2013-06-26 LAB — COMPREHENSIVE METABOLIC PANEL
ALT: 13 U/L (ref 0–35)
AST: 16 U/L (ref 0–37)
Albumin: 4.4 g/dL (ref 3.5–5.2)
Alkaline Phosphatase: 73 U/L (ref 39–117)
BUN: 24 mg/dL — AB (ref 6–23)
CALCIUM: 9.2 mg/dL (ref 8.4–10.5)
CO2: 27 mEq/L (ref 19–32)
CREATININE: 0.81 mg/dL (ref 0.50–1.10)
Chloride: 102 mEq/L (ref 96–112)
Glucose, Bld: 79 mg/dL (ref 70–99)
Potassium: 4.2 mEq/L (ref 3.5–5.3)
Sodium: 137 mEq/L (ref 135–145)
Total Bilirubin: 0.4 mg/dL (ref 0.2–1.2)
Total Protein: 6.9 g/dL (ref 6.0–8.3)

## 2013-06-26 LAB — TSH: TSH: 1.218 u[IU]/mL (ref 0.350–4.500)

## 2013-06-26 LAB — LIPID PANEL
Cholesterol: 141 mg/dL (ref 0–200)
HDL: 65 mg/dL (ref >39)
LDL CALC: 63 mg/dL (ref 0–99)
Total CHOL/HDL Ratio: 2.2 Ratio
Triglycerides: 65 mg/dL (ref <150)
VLDL: 13 mg/dL (ref 0–40)

## 2013-06-26 LAB — PROLACTIN: PROLACTIN: 5.8 ng/mL

## 2013-06-26 LAB — FOLLICLE STIMULATING HORMONE: FSH: 9.9 m[IU]/mL

## 2013-07-28 ENCOUNTER — Other Ambulatory Visit: Payer: Self-pay

## 2013-07-28 MED ORDER — NORETHINDRONE ACET-ETHINYL EST 1-20 MG-MCG PO TABS: 1.0000 | ORAL_TABLET | Freq: Every day | ORAL | Status: DC

## 2013-11-16 ENCOUNTER — Encounter: Payer: Self-pay | Admitting: Gynecology

## 2014-01-15 HISTORY — PX: APPENDECTOMY: SHX54

## 2014-04-23 ENCOUNTER — Other Ambulatory Visit: Payer: Self-pay

## 2014-04-23 ENCOUNTER — Ambulatory Visit
Admission: RE | Admit: 2014-04-23 | Discharge: 2014-04-23 | Disposition: A | Discharge status: Home / Self Care | Payer: 59 | Source: Ambulatory Visit

## 2014-04-23 DIAGNOSIS — Z1231 Encounter for screening mammogram for malignant neoplasm of breast: Secondary | ICD-10-CM

## 2014-04-27 ENCOUNTER — Other Ambulatory Visit: Payer: Self-pay | Admitting: Internal Medicine

## 2014-04-27 DIAGNOSIS — R928 Other abnormal and inconclusive findings on diagnostic imaging of breast: Secondary | ICD-10-CM

## 2014-04-28 ENCOUNTER — Ambulatory Visit
Admission: RE | Admit: 2014-04-28 | Discharge: 2014-04-28 | Disposition: A | Discharge status: Home / Self Care | Payer: 59 | Source: Ambulatory Visit | Attending: Internal Medicine | Admitting: Internal Medicine

## 2014-04-28 DIAGNOSIS — R928 Other abnormal and inconclusive findings on diagnostic imaging of breast: Secondary | ICD-10-CM

## 2014-06-28 ENCOUNTER — Encounter: Payer: Managed Care, Other (non HMO) | Admitting: Gynecology

## 2014-07-07 ENCOUNTER — Ambulatory Visit (INDEPENDENT_AMBULATORY_CARE_PROVIDER_SITE_OTHER): Payer: Managed Care, Other (non HMO) | Admitting: Gynecology

## 2014-07-07 ENCOUNTER — Encounter: Payer: Self-pay | Admitting: Gynecology

## 2014-07-07 VITALS — BP 118/74 | Ht 63.5 in | Wt 163.0 lb

## 2014-07-07 DIAGNOSIS — Z01419 Encounter for gynecological examination (general) (routine) without abnormal findings: Secondary | ICD-10-CM

## 2014-07-07 LAB — COMPREHENSIVE METABOLIC PANEL
ALK PHOS: 44 U/L (ref 39–117)
ALT: 10 U/L (ref 0–35)
AST: 10 U/L (ref 0–37)
Albumin: 4 g/dL (ref 3.5–5.2)
BILIRUBIN TOTAL: 0.3 mg/dL (ref 0.2–1.2)
BUN: 14 mg/dL (ref 6–23)
CO2: 26 meq/L (ref 19–32)
CREATININE: 0.8 mg/dL (ref 0.50–1.10)
Calcium: 9 mg/dL (ref 8.4–10.5)
Chloride: 103 mEq/L (ref 96–112)
Glucose, Bld: 83 mg/dL (ref 70–99)
Potassium: 3.9 mEq/L (ref 3.5–5.3)
Sodium: 138 mEq/L (ref 135–145)
Total Protein: 6.5 g/dL (ref 6.0–8.3)

## 2014-07-07 LAB — LIPID PANEL
CHOL/HDL RATIO: 3 ratio
Cholesterol: 165 mg/dL (ref 0–200)
HDL: 55 mg/dL (ref >=46)
LDL Cholesterol: 84 mg/dL (ref 0–99)
Triglycerides: 130 mg/dL (ref <150)
VLDL: 26 mg/dL (ref 0–40)

## 2014-07-07 LAB — CBC WITH DIFFERENTIAL/PLATELET
BASOS ABS: 0 10*3/uL (ref 0.0–0.1)
Basophils Relative: 0 % (ref 0–1)
EOS ABS: 0.2 10*3/uL (ref 0.0–0.7)
Eosinophils Relative: 2 % (ref 0–5)
HCT: 36.9 % (ref 36.0–46.0)
Hemoglobin: 12.4 g/dL (ref 12.0–15.0)
Lymphocytes Relative: 40 % (ref 12–46)
Lymphs Abs: 3.6 10*3/uL (ref 0.7–4.0)
MCH: 30.2 pg (ref 26.0–34.0)
MCHC: 33.6 g/dL (ref 30.0–36.0)
MCV: 89.8 fL (ref 78.0–100.0)
MPV: 10.1 fL (ref 8.6–12.4)
Monocytes Absolute: 0.7 10*3/uL (ref 0.1–1.0)
Monocytes Relative: 8 % (ref 3–12)
NEUTROS ABS: 4.5 10*3/uL (ref 1.7–7.7)
Neutrophils Relative %: 50 % (ref 43–77)
PLATELETS: 244 10*3/uL (ref 150–400)
RBC: 4.11 MIL/uL (ref 3.87–5.11)
RDW: 13.8 % (ref 11.5–15.5)
WBC: 9 10*3/uL (ref 4.0–10.5)

## 2014-07-07 MED ORDER — NORETHINDRONE ACET-ETHINYL EST 1-20 MG-MCG PO TABS: 1.0000 | ORAL_TABLET | Freq: Every day | ORAL | Status: DC

## 2014-07-07 NOTE — Progress Notes (Signed)
Ellen Hayden [birth year] [id number]        49 y.o.  [id number] for annual exam.  Doing well.  Past medical history,surgical history, problem list, medications, allergies, family history and social history were all reviewed and documented as reviewed in the EPIC chart.  ROS:  Performed with pertinent positives and negatives included in the history, assessment and plan.   Additional significant findings :  none   Exam: Kim Counsellor Vitals:   07/07/14 1530  BP: 118/74  Height: 5' 3.5" (1.613 m)  Weight: 163 lb (73.936 kg)   General appearance:  Normal affect, orientation and appearance. Skin: Grossly normal HEENT: Without gross lesions.  No cervical or supraclavicular adenopathy. Thyroid normal.  Lungs:  Clear without wheezing, rales or rhonchi Cardiac: RR, without RMG Abdominal:  Soft, nontender, without masses, guarding, rebound, organomegaly or hernia Breasts:  Examined lying and sitting without masses, retractions, discharge or axillary adenopathy. Pelvic:  Ext/BUS/vagina normal  Cervix normal  Uterus anteverted, normal size, shape and contour, midline and mobile nontender   Adnexa  Without masses or tenderness    Anus and perineum  Normal   Rectovaginal  Normal sphincter tone without palpated masses or tenderness.    Assessment/Plan:  49 y.o. [id number] female for annual exam with regular menses, oral contraceptives.   1. Patient continues on Loestrin 1/20 oral contraceptives for menstrual regulation. Doing well with this wants to continue. I again reviewed issues of birth control pills and advancing age to include risks of stroke heart attack DVT. Patient's never smoked and not being followed for any significant medical diseases. Understands and accepts risks. Refill 1 year provided. 2. Pap smear/HPV negative 2014. No Pap smear done today.  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 Repeat at 3-5 year interval per current screening  guidelines. 3. Mammography 04/2014. Had call back on right breast for questionable shadow with negative follow up views and ultrasound. Was recommended for 6 month follow up and I encouraged her to follow up for this. SBE monthly reviewed. 4. Health maintenance. Baseline CBC, comprehensive metabolic panel, lipid profile, TSH, vitamin D, urinalysis ordered. Follow up in one year, sooner as needed.   Anastasio Auerbach MD, 3:50 PM 07/07/2014

## 2014-07-07 NOTE — Patient Instructions (Signed)
You may obtain a copy of any labs that were done today by logging onto MyChart as outlined in the instructions provided with your AVS (after visit summary). The office will not call with normal lab results but certainly if there are any significant abnormalities then we will contact you.   Health Maintenance, Female A healthy lifestyle and preventative care can promote health and wellness.  Maintain regular health, dental, and eye exams.  Eat a healthy diet. Foods like vegetables, fruits, whole grains, low-fat dairy products, and lean protein foods contain the nutrients you need without too many calories. Decrease your intake of foods high in solid fats, added sugars, and salt. Get information about a proper diet from your caregiver, if necessary.  Regular physical exercise is one of the most important things you can do for your health. Most adults should get at least 150 minutes of moderate-intensity exercise (any activity that increases your heart rate and causes you to sweat) each week. In addition, most adults need muscle-strengthening exercises on 2 or more days a week.   Maintain a healthy weight. The body mass index (BMI) is a screening tool to identify possible weight problems. It provides an estimate of body fat based on height and weight. Your caregiver can help determine your BMI, and can help you achieve or maintain a healthy weight. For adults 20 years and older:  A BMI below 18.5 is considered underweight.  A BMI of 18.5 to 24.9 is normal.  A BMI of 25 to 29.9 is considered overweight.  A BMI of 30 and above is considered obese.  Maintain normal blood lipids and cholesterol by exercising and minimizing your intake of saturated fat. Eat a balanced diet with plenty of fruits and vegetables. Blood tests for lipids and cholesterol should begin at age 61 and be repeated every 5 years. If your lipid or cholesterol levels are high, you are over 50, or you are a high risk for heart  disease, you may need your cholesterol levels checked more frequently.Ongoing high lipid and cholesterol levels should be treated with medicines if diet and exercise are not effective.  If you smoke, find out from your caregiver how to quit. If you do not use tobacco, do not start.  Lung cancer screening is recommended for adults aged 33 80 years who are at high risk for developing lung cancer because of a history of smoking. Yearly low-dose computed tomography (CT) is recommended for people who have at least a 30-pack-year history of smoking and are a current smoker or have quit within the past 15 years. A pack year of smoking is smoking an average of 1 pack of cigarettes a day for 1 year (for example: 1 pack a day for 30 years or 2 packs a day for 15 years). Yearly screening should continue until the smoker has stopped smoking for at least 15 years. Yearly screening should also be stopped for people who develop a health problem that would prevent them from having lung cancer treatment.  If you are pregnant, do not drink alcohol. If you are breastfeeding, be very cautious about drinking alcohol. If you are not pregnant and choose to drink alcohol, do not exceed 1 drink per day. One drink is considered to be 12 ounces (355 mL) of beer, 5 ounces (148 mL) of wine, or 1.5 ounces (44 mL) of liquor.  Avoid use of street drugs. Do not share needles with anyone. Ask for help if you need support or instructions about stopping  the use of drugs.  High blood pressure causes heart disease and increases the risk of stroke. Blood pressure should be checked at least every 1 to 2 years. Ongoing high blood pressure should be treated with medicines, if weight loss and exercise are not effective.  If you are 59 to 48 years old, ask your caregiver if you should take aspirin to prevent strokes.  Diabetes screening involves taking a blood sample to check your fasting blood sugar level. This should be done once every 3  years, after age 91, if you are within normal weight and without risk factors for diabetes. Testing should be considered at a younger age or be carried out more frequently if you are overweight and have at least 1 risk factor for diabetes.  Breast cancer screening is essential preventative care for women. You should practice "breast self-awareness." This means understanding the normal appearance and feel of your breasts and may include breast self-examination. Any changes detected, no matter how small, should be reported to a caregiver. Women in their 66s and 30s should have a clinical breast exam (CBE) by a caregiver as part of a regular health exam every 1 to 3 years. After age 101, women should have a CBE every year. Starting at age 100, women should consider having a mammogram (breast X-ray) every year. Women who have a family history of breast cancer should talk to their caregiver about genetic screening. Women at a high risk of breast cancer should talk to their caregiver about having an MRI and a mammogram every year.  Breast cancer gene (BRCA)-related cancer risk assessment is recommended for women who have family members with BRCA-related cancers. BRCA-related cancers include breast, ovarian, tubal, and peritoneal cancers. Having family members with these cancers may be associated with an increased risk for harmful changes (mutations) in the breast cancer genes BRCA1 and BRCA2. Results of the assessment will determine the need for genetic counseling and BRCA1 and BRCA2 testing.  The Pap test is a screening test for cervical cancer. Women should have a Pap test starting at age 57. Between ages 25 and 35, Pap tests should be repeated every 2 years. Beginning at age 37, you should have a Pap test every 3 years as long as the past 3 Pap tests have been normal. If you had a hysterectomy for a problem that was not cancer or a condition that could lead to cancer, then you no longer need Pap tests. If you are  between ages 50 and 76, and you have had normal Pap tests going back 10 years, you no longer need Pap tests. If you have had past treatment for cervical cancer or a condition that could lead to cancer, you need Pap tests and screening for cancer for at least 20 years after your treatment. If Pap tests have been discontinued, risk factors (such as a new sexual partner) need to be reassessed to determine if screening should be resumed. Some women have medical problems that increase the chance of getting cervical cancer. In these cases, your caregiver may recommend more frequent screening and Pap tests.  The human papillomavirus (HPV) test is an additional test that may be used for cervical cancer screening. The HPV test looks for the virus that can cause the cell changes on the cervix. The cells collected during the Pap test can be tested for HPV. The HPV test could be used to screen women aged 44 years and older, and should be used in women of any age  who have unclear Pap test results. After the age of 55, women should have HPV testing at the same frequency as a Pap test.  Colorectal cancer can be detected and often prevented. Most routine colorectal cancer screening begins at the age of 44 and continues through age 20. However, your caregiver may recommend screening at an earlier age if you have risk factors for colon cancer. On a yearly basis, your caregiver may provide home test kits to check for hidden blood in the stool. Use of a small camera at the end of a tube, to directly examine the colon (sigmoidoscopy or colonoscopy), can detect the earliest forms of colorectal cancer. Talk to your caregiver about this at age 86, when routine screening begins. Direct examination of the colon should be repeated every 5 to 10 years through age 13, unless early forms of pre-cancerous polyps or small growths are found.  Hepatitis C blood testing is recommended for all people born from [birth year] through 1964 and any  individual with known risks for hepatitis C.  Practice safe sex. Use condoms and avoid high-risk sexual practices to reduce the spread of sexually transmitted infections (STIs). Sexually active women aged 36 and younger should be checked for Chlamydia, which is a common sexually transmitted infection. Older women with new or multiple partners should also be tested for Chlamydia. Testing for other STIs is recommended if you are sexually active and at increased risk.  Osteoporosis is a disease in which the bones lose minerals and strength with aging. This can result in serious bone fractures. The risk of osteoporosis can be identified using a bone density scan. Women ages 20 and over and women at risk for fractures or osteoporosis should discuss screening with their caregivers. Ask your caregiver whether you should be taking a calcium supplement or vitamin D to reduce the rate of osteoporosis.  Menopause can be associated with physical symptoms and risks. Hormone replacement therapy is available to decrease symptoms and risks. You should talk to your caregiver about whether hormone replacement therapy is right for you.  Use sunscreen. Apply sunscreen liberally and repeatedly throughout the day. You should seek shade when your shadow is shorter than you. Protect yourself by wearing long sleeves, pants, a wide-brimmed hat, and sunglasses year round, whenever you are outdoors.  Notify your caregiver of new moles or changes in moles, especially if there is a change in shape or color. Also notify your caregiver if a mole is larger than the size of a pencil eraser.  Stay current with your immunizations. Document Released: 07/17/2010 Document Revised: 04/28/2012 Document Reviewed: 07/17/2010 [facility] Patient Information 2014 [employer].

## 2014-07-08 LAB — VITAMIN D 25 HYDROXY (VIT D DEFICIENCY, FRACTURES): VIT D 25 HYDROXY: 48 ng/mL (ref 30–100)

## 2014-07-08 LAB — TSH: TSH: 1.477 u[IU]/mL (ref 0.350–4.500)

## 2014-07-09 LAB — URINALYSIS W MICROSCOPIC + REFLEX CULTURE
BILIRUBIN URINE: NEGATIVE
Bacteria, UA: NONE SEEN
CASTS: NONE SEEN
Crystals: NONE SEEN
Glucose, UA: NEGATIVE mg/dL
Hgb urine dipstick: NEGATIVE
KETONES UR: NEGATIVE mg/dL
Leukocytes, UA: NEGATIVE
NITRITE: NEGATIVE
PH: 7 (ref 5.0–8.0)
Protein, ur: NEGATIVE mg/dL
Specific Gravity, Urine: 1.013 (ref 1.005–1.030)
Squamous Epithelial / LPF: NONE SEEN
Urobilinogen, UA: 0.2 mg/dL (ref 0.0–1.0)

## 2014-07-12 ENCOUNTER — Other Ambulatory Visit: Payer: Self-pay | Admitting: Gynecology

## 2014-07-26 ENCOUNTER — Encounter (HOSPITAL_COMMUNITY): Payer: Self-pay | Admitting: Emergency Medicine

## 2014-07-26 ENCOUNTER — Emergency Department (HOSPITAL_COMMUNITY): Payer: Managed Care, Other (non HMO)

## 2014-07-26 ENCOUNTER — Inpatient Hospital Stay (HOSPITAL_COMMUNITY)
Admission: EM | Admit: 2014-07-26 | Discharge: 2014-08-03 | DRG: 339 | Disposition: A | Discharge status: Home / Self Care | Payer: Managed Care, Other (non HMO) | Attending: Surgery | Admitting: Surgery

## 2014-07-26 DIAGNOSIS — Z808 Family history of malignant neoplasm of other organs or systems: Secondary | ICD-10-CM

## 2014-07-26 DIAGNOSIS — Z8249 Family history of ischemic heart disease and other diseases of the circulatory system: Secondary | ICD-10-CM

## 2014-07-26 DIAGNOSIS — Z881 Allergy status to other antibiotic agents status: Secondary | ICD-10-CM

## 2014-07-26 DIAGNOSIS — Z800 Family history of malignant neoplasm of digestive organs: Secondary | ICD-10-CM

## 2014-07-26 DIAGNOSIS — K567 Ileus, unspecified: Secondary | ICD-10-CM | POA: Diagnosis not present

## 2014-07-26 DIAGNOSIS — K3580 Unspecified acute appendicitis: Secondary | ICD-10-CM

## 2014-07-26 DIAGNOSIS — K353 Acute appendicitis with localized peritonitis, without perforation or gangrene: Secondary | ICD-10-CM

## 2014-07-26 DIAGNOSIS — Z87410 Personal history of cervical dysplasia: Secondary | ICD-10-CM

## 2014-07-26 DIAGNOSIS — R1031 Right lower quadrant pain: Secondary | ICD-10-CM | POA: Diagnosis not present

## 2014-07-26 DIAGNOSIS — E860 Dehydration: Secondary | ICD-10-CM | POA: Diagnosis present

## 2014-07-26 LAB — URINALYSIS, ROUTINE W REFLEX MICROSCOPIC
Bilirubin Urine: NEGATIVE
GLUCOSE, UA: NEGATIVE mg/dL
Hgb urine dipstick: NEGATIVE
Leukocytes, UA: NEGATIVE
Nitrite: NEGATIVE
Protein, ur: NEGATIVE mg/dL
Specific Gravity, Urine: 1.027 (ref 1.005–1.030)
UROBILINOGEN UA: 1 mg/dL (ref 0.0–1.0)
pH: 7 (ref 5.0–8.0)

## 2014-07-26 LAB — COMPREHENSIVE METABOLIC PANEL
ALT: 14 U/L (ref 14–54)
AST: 14 U/L — ABNORMAL LOW (ref 15–41)
Albumin: 4.1 g/dL (ref 3.5–5.0)
Alkaline Phosphatase: 48 U/L (ref 38–126)
Anion gap: 10 (ref 5–15)
BILIRUBIN TOTAL: 1.2 mg/dL (ref 0.3–1.2)
BUN: 12 mg/dL (ref 6–20)
CO2: 23 mmol/L (ref 22–32)
Calcium: 8.7 mg/dL — ABNORMAL LOW (ref 8.9–10.3)
Chloride: 102 mmol/L (ref 101–111)
Creatinine, Ser: 0.79 mg/dL (ref 0.44–1.00)
Glucose, Bld: 103 mg/dL — ABNORMAL HIGH (ref 65–99)
POTASSIUM: 3.6 mmol/L (ref 3.5–5.1)
SODIUM: 135 mmol/L (ref 135–145)
Total Protein: 7.9 g/dL (ref 6.5–8.1)

## 2014-07-26 LAB — I-STAT BETA HCG BLOOD, ED (MC, WL, AP ONLY): I-stat hCG, quantitative: <5 m[IU]/mL (ref <5)

## 2014-07-26 LAB — CBC
HCT: 37.5 % (ref 36.0–46.0)
Hemoglobin: 12.8 g/dL (ref 12.0–15.0)
MCH: 30.5 pg (ref 26.0–34.0)
MCHC: 34.1 g/dL (ref 30.0–36.0)
MCV: 89.5 fL (ref 78.0–100.0)
Platelets: 234 10*3/uL (ref 150–400)
RBC: 4.19 MIL/uL (ref 3.87–5.11)
RDW: 14 % (ref 11.5–15.5)
WBC: 21.9 10*3/uL — AB (ref 4.0–10.5)

## 2014-07-26 LAB — LIPASE, BLOOD: LIPASE: 22 U/L (ref 22–51)

## 2014-07-26 MED ORDER — HYDROMORPHONE HCL 1 MG/ML IJ SOLN
1.0000 mg | Freq: Once | INTRAMUSCULAR | Status: AC
  Administered 2014-07-26: 1 mg via INTRAVENOUS
  Filled 2014-07-26: qty 1

## 2014-07-26 MED ORDER — ONDANSETRON HCL 4 MG/2ML IJ SOLN
4.0000 mg | Freq: Once | INTRAMUSCULAR | Status: AC
  Administered 2014-07-26: 4 mg via INTRAVENOUS
  Filled 2014-07-26: qty 2

## 2014-07-26 MED ORDER — IOHEXOL 300 MG/ML  SOLN
100.0000 mL | Freq: Once | INTRAMUSCULAR | Status: AC | PRN
  Administered 2014-07-26: 100 mL via INTRAVENOUS

## 2014-07-26 NOTE — ED Notes (Signed)
Pt states she has lower abd pain that started yesterday  Pt states the pain has moved from her belly button to her lower abdomen  Pt states she is tender and feels like she has worked out but has not  Pt states she has not had good po intake today  Pt has a low grade fever but has not taken anything for it  Pt had nausea yesterday without vomiting  Pt states she took a stool softener last night and this morning  Had a bowel movement today but states it was not as much as normal for her

## 2014-07-26 NOTE — ED Provider Notes (Signed)
CSN: [id number]     Arrival date & time 07/26/14  1857 History   First MD Initiated Contact with Patient 07/26/14 2140     Chief Complaint  Patient presents with  . Abdominal Pain     (Consider location/radiation/quality/duration/timing/severity/associated sxs/prior Treatment) HPI Comments: Patient presents to the emergency department with chief complaint of abdominal pain. She states that the pain started near her bellybutton yesterday. She states that she has had some nausea, but no vomiting. She describes the pain as a cramping sensation. She states that this morning the pain moved to her right lower quadrant. She reports running a low-grade fever. She has had regular bowel movements. She denies any prior abdominal surgeries with the exception of cesarean section. She has not taken anything for pain. The pain is worsened with palpation.  The history is provided by the patient. No language interpreter was used.    Past Medical History  Diagnosis Date  . Cervical dysplasia 2000    mild dysplasia with no treatment and normal paps since   Past Surgical History  Procedure Laterality Date  . Eye surgery  1970    STRABISMUS  . Hernia repair  1968  . Cesarean section  2007    UNFAVORABLE CERVIX-4200 GRAMS  . Cesarean section  2009  . Colposcopy     Family History  Problem Relation Age of Onset  . Hypertension Father   . Cancer Maternal Grandmother     THROAT - SMOKER  . Cancer Paternal Grandmother     LIVER   History  Substance Use Topics  . Smoking status: Never Smoker   . Smokeless tobacco: Never Used  . Alcohol Use: 1.8 oz/week    3 Standard drinks or equivalent per week   OB History    Gravida Para Term Preterm AB TAB SAB Ectopic Multiple Living   3 2 2  1     2      Review of Systems  Constitutional: Negative for fever and chills.  Respiratory: Negative for shortness of breath.   Cardiovascular: Negative for chest pain.  Gastrointestinal: Positive for nausea and  abdominal pain. Negative for vomiting, diarrhea and constipation.  Genitourinary: Negative for dysuria.  All other systems reviewed and are negative.     Allergies  Ciprofloxacin  Home Medications   Prior to Admission medications   Medication Sig Start Date End Date Taking? Authorizing Provider  Cetirizine HCl (ZYRTEC ALLERGY PO) Take 1 tablet by mouth daily.   Yes Historical Provider, MD  JUNEL 1/20 1-20 MG-MCG tablet TAKE 1 TABLET BY MOUTH DAILY. Patient taking differently: Take 1 tablet by mouth daily. 07/12/14  Yes Anastasio Auerbach, MD  norethindrone-ethinyl estradiol (MICROGESTIN,JUNEL,LOESTRIN) 1-20 MG-MCG tablet Take 1 tablet by mouth daily. Patient not taking: Reported on 07/26/2014 07/07/14   Anastasio Auerbach, MD   BP 124/77 mmHg  Pulse 99  Temp(Src) 99.9 F (37.7 C) (Oral)  Resp 16  SpO2 100%  LMP 05/05/2014 Physical Exam  Constitutional: She is oriented to person, place, and time. She appears well-developed and well-nourished.  HENT:  Head: Normocephalic and atraumatic.  Eyes: Conjunctivae and EOM are normal. Pupils are equal, round, and reactive to light.  Neck: Normal range of motion. Neck supple.  Cardiovascular: Normal rate and regular rhythm.  Exam reveals no gallop and no friction rub.   No murmur heard. Pulmonary/Chest: Effort normal and breath sounds normal. No respiratory distress. She has no wheezes. She has no rales. She exhibits no tenderness.  Abdominal: Soft.  Bowel sounds are normal. She exhibits no distension and no mass. There is tenderness. There is no rebound and no guarding.  Right lower quadrant is very tender to palpation, there is no other focal abdominal tenderness  Musculoskeletal: Normal range of motion. She exhibits no edema or tenderness.  Neurological: She is alert and oriented to person, place, and time.  Skin: Skin is warm and dry.  Psychiatric: She has a normal mood and affect. Her behavior is normal. Judgment and thought content  normal.  Nursing note and vitals reviewed.   ED Course  Procedures (including critical care time) Results for orders placed or performed during the hospital encounter of 07/26/14  Lipase, blood  Result Value Ref Range   Lipase 22 22 - 51 U/L  Comprehensive metabolic panel  Result Value Ref Range   Sodium 135 135 - 145 mmol/L   Potassium 3.6 3.5 - 5.1 mmol/L   Chloride 102 101 - 111 mmol/L   CO2 23 22 - 32 mmol/L   Glucose, Bld 103 (H) 65 - 99 mg/dL   BUN 12 6 - 20 mg/dL   Creatinine, Ser 0.79 0.44 - 1.00 mg/dL   Calcium 8.7 (L) 8.9 - 10.3 mg/dL   Total Protein 7.9 6.5 - 8.1 g/dL   Albumin 4.1 3.5 - 5.0 g/dL   AST 14 (L) 15 - 41 U/L   ALT 14 14 - 54 U/L   Alkaline Phosphatase 48 38 - 126 U/L   Total Bilirubin 1.2 0.3 - 1.2 mg/dL   GFR calc non Af Amer >60 >60 mL/min   GFR calc Af Amer >60 >60 mL/min   Anion gap 10 5 - 15  CBC  Result Value Ref Range   WBC 21.9 (H) 4.0 - 10.5 K/uL   RBC 4.19 3.87 - 5.11 MIL/uL   Hemoglobin 12.8 12.0 - 15.0 g/dL   HCT 37.5 36.0 - 46.0 %   MCV 89.5 78.0 - 100.0 fL   MCH 30.5 26.0 - 34.0 pg   MCHC 34.1 30.0 - 36.0 g/dL   RDW 14.0 11.5 - 15.5 %   Platelets 234 150 - 400 K/uL  Urinalysis, Routine w reflex microscopic (not at [facility])  Result Value Ref Range   Color, Urine AMBER (A) YELLOW   APPearance CLEAR CLEAR   Specific Gravity, Urine 1.027 1.005 - 1.030   pH 7.0 5.0 - 8.0   Glucose, UA NEGATIVE NEGATIVE mg/dL   Hgb urine dipstick NEGATIVE NEGATIVE   Bilirubin Urine NEGATIVE NEGATIVE   Ketones, ur >80 (A) NEGATIVE mg/dL   Protein, ur NEGATIVE NEGATIVE mg/dL   Urobilinogen, UA 1.0 0.0 - 1.0 mg/dL   Nitrite NEGATIVE NEGATIVE   Leukocytes, UA NEGATIVE NEGATIVE  I-Stat beta hCG blood, ED ([facility], [facility], [facility] only)  Result Value Ref Range   I-stat hCG, quantitative <5.0 <5 mIU/mL   Comment 3           Ct Abdomen Pelvis W Contrast  07/26/2014   CLINICAL DATA:  Lower abdominal pain starting yesterday and extending from the belly button to the  lower abdomen. Poor oral intake. Nausea. Bowel movement today.  EXAM: CT ABDOMEN AND PELVIS WITH CONTRAST  TECHNIQUE: Multidetector CT imaging of the abdomen and pelvis was performed using the standard protocol following bolus administration of intravenous contrast.  CONTRAST:  14mL OMNIPAQUE IOHEXOL 300 MG/ML  SOLN  COMPARISON:  MRI abdomen 03/29/2009.  FINDINGS: Mild dependent changes in the lung bases.  Low-attenuation lesions scattered throughout the liver, largest measuring  2.1 cm diameter. These were shoulder represent cysts and hemangiomas on prior MRI. Small focal area of fatty infiltration adjacent to the falciform ligament. The gallbladder, spleen, pancreas, adrenal glands, kidneys, abdominal aorta, inferior vena cava, and retroperitoneal lymph nodes are unremarkable. Stomach is decompressed. Small bowel and colon are not abnormally distended. No free air or free fluid in the abdomen.  Pelvis: The appendix is diffusely distended, measuring up to about 11 mm diameter. An appendicolith is present at the base. There is inflammatory stranding in the right lower quadrant and around the appendix. Changes are consistent with acute appendicitis. No abscess identified. 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 Bladder is decompressed. No abnormal adnexal masses. No significant lymphadenopathy. No destructive bone lesions.  IMPRESSION: At appendicolith and distended appendix with inflammatory changes in the right lower quadrant consistent with acute appendicitis. No abscess.   Electronically Signed   By: Lucienne Capers M.D.   On: 07/26/2014 23:50      EKG Interpretation None      MDM   Final diagnoses:  RLQ abdominal pain  Acute appendicitis with localized peritonitis    Patient with right lower quadrant abdominal pain. Concern for appendicitis. Will order CT scan. Leukocytosis to 21.9. Patient is febrile at 99.9. Last intake was today at 1:00. We'll treat pain and nausea.  CT consistent with  acute appendicitis.  Discussed the patient with Dr. Tomi Bamberger, who agrees with plan for surgery consultation.  Appreciate Dr. Harlow Asa for coming to admit the patient.   Montine Circle, PA-C 07/27/14 0019  Dorie Rank, MD 07/28/14 [phone]

## 2014-07-27 ENCOUNTER — Encounter (HOSPITAL_COMMUNITY): Payer: Self-pay | Admitting: Surgery

## 2014-07-27 ENCOUNTER — Observation Stay (HOSPITAL_COMMUNITY): Payer: Managed Care, Other (non HMO) | Admitting: Anesthesiology

## 2014-07-27 ENCOUNTER — Encounter (HOSPITAL_COMMUNITY): Admission: EM | Disposition: A | Discharge status: Home / Self Care | Payer: Self-pay | Source: Home / Self Care

## 2014-07-27 DIAGNOSIS — R1031 Right lower quadrant pain: Secondary | ICD-10-CM | POA: Diagnosis present

## 2014-07-27 DIAGNOSIS — Z8249 Family history of ischemic heart disease and other diseases of the circulatory system: Secondary | ICD-10-CM | POA: Diagnosis not present

## 2014-07-27 DIAGNOSIS — K567 Ileus, unspecified: Secondary | ICD-10-CM | POA: Diagnosis not present

## 2014-07-27 DIAGNOSIS — Z808 Family history of malignant neoplasm of other organs or systems: Secondary | ICD-10-CM | POA: Diagnosis not present

## 2014-07-27 DIAGNOSIS — Z881 Allergy status to other antibiotic agents status: Secondary | ICD-10-CM | POA: Diagnosis not present

## 2014-07-27 DIAGNOSIS — K353 Acute appendicitis with localized peritonitis: Secondary | ICD-10-CM | POA: Diagnosis present

## 2014-07-27 DIAGNOSIS — Z800 Family history of malignant neoplasm of digestive organs: Secondary | ICD-10-CM | POA: Diagnosis not present

## 2014-07-27 DIAGNOSIS — Z87410 Personal history of cervical dysplasia: Secondary | ICD-10-CM | POA: Diagnosis not present

## 2014-07-27 DIAGNOSIS — E860 Dehydration: Secondary | ICD-10-CM | POA: Diagnosis present

## 2014-07-27 HISTORY — PX: LAPAROSCOPIC APPENDECTOMY: SHX408

## 2014-07-27 LAB — CREATININE, SERUM
CREATININE: 0.78 mg/dL (ref 0.44–1.00)
GFR calc non Af Amer: >60 mL/min (ref >60)

## 2014-07-27 LAB — SURGICAL PCR SCREEN
MRSA, PCR: NEGATIVE
Staphylococcus aureus: POSITIVE — AB

## 2014-07-27 LAB — CBC
HEMATOCRIT: 33.3 % — AB (ref 36.0–46.0)
Hemoglobin: 11.2 g/dL — ABNORMAL LOW (ref 12.0–15.0)
MCH: 30.6 pg (ref 26.0–34.0)
MCHC: 33.6 g/dL (ref 30.0–36.0)
MCV: 91 fL (ref 78.0–100.0)
Platelets: 207 10*3/uL (ref 150–400)
RBC: 3.66 MIL/uL — ABNORMAL LOW (ref 3.87–5.11)
RDW: 14.3 % (ref 11.5–15.5)
WBC: 13.7 10*3/uL — AB (ref 4.0–10.5)

## 2014-07-27 SURGERY — APPENDECTOMY, LAPAROSCOPIC
Anesthesia: General | Site: Abdomen

## 2014-07-27 MED ORDER — PHENYLEPHRINE HCL 10 MG/ML IJ SOLN
INTRAMUSCULAR | Status: DC | PRN
  Administered 2014-07-27: 40 ug via INTRAVENOUS
  Administered 2014-07-27: 120 ug via INTRAVENOUS

## 2014-07-27 MED ORDER — PHENYLEPHRINE 40 MCG/ML (10ML) SYRINGE FOR IV PUSH (FOR BLOOD PRESSURE SUPPORT)
PREFILLED_SYRINGE | INTRAVENOUS | Status: AC
  Filled 2014-07-27: qty 10

## 2014-07-27 MED ORDER — FENTANYL CITRATE (PF) 100 MCG/2ML IJ SOLN
INTRAMUSCULAR | Status: DC | PRN
  Administered 2014-07-27 (×2): 50 ug via INTRAVENOUS
  Administered 2014-07-27: 100 ug via INTRAVENOUS
  Administered 2014-07-27: 50 ug via INTRAVENOUS

## 2014-07-27 MED ORDER — LIDOCAINE HCL (CARDIAC) 20 MG/ML IV SOLN
INTRAVENOUS | Status: AC
  Filled 2014-07-27: qty 5

## 2014-07-27 MED ORDER — ROCURONIUM BROMIDE 100 MG/10ML IV SOLN
INTRAVENOUS | Status: DC | PRN
  Administered 2014-07-27: 25 mg via INTRAVENOUS
  Administered 2014-07-27: 5 mg via INTRAVENOUS

## 2014-07-27 MED ORDER — ONDANSETRON HCL 4 MG/2ML IJ SOLN
INTRAMUSCULAR | Status: AC
  Filled 2014-07-27: qty 2

## 2014-07-27 MED ORDER — LACTATED RINGERS IV SOLN
INTRAVENOUS | Status: DC
  Administered 2014-07-27: 14:00:00 via INTRAVENOUS
  Administered 2014-07-27: 1000 mL via INTRAVENOUS

## 2014-07-27 MED ORDER — MUPIROCIN 2 % EX OINT
1.0000 "application " | TOPICAL_OINTMENT | Freq: Two times a day (BID) | CUTANEOUS | Status: DC
  Administered 2014-07-27: 1 via NASAL
  Filled 2014-07-27: qty 22

## 2014-07-27 MED ORDER — SODIUM CHLORIDE 0.9 % IV BOLUS (SEPSIS)
500.0000 mL | Freq: Once | INTRAVENOUS | Status: AC
  Administered 2014-07-27: 500 mL via INTRAVENOUS

## 2014-07-27 MED ORDER — NEOSTIGMINE METHYLSULFATE 10 MG/10ML IV SOLN
INTRAVENOUS | Status: DC | PRN
  Administered 2014-07-27: 3 mg via INTRAVENOUS

## 2014-07-27 MED ORDER — KCL IN DEXTROSE-NACL 20-5-0.45 MEQ/L-%-% IV SOLN
INTRAVENOUS | Status: DC
  Administered 2014-07-28: 08:00:00 via INTRAVENOUS
  Administered 2014-07-29: 100 mL/h via INTRAVENOUS
  Administered 2014-07-30: 75 mL/h via INTRAVENOUS
  Administered 2014-08-01 (×2): via INTRAVENOUS
  Filled 2014-07-27 (×11): qty 1000

## 2014-07-27 MED ORDER — KCL IN DEXTROSE-NACL 20-5-0.45 MEQ/L-%-% IV SOLN
INTRAVENOUS | Status: DC
  Administered 2014-07-27: 01:00:00 via INTRAVENOUS
  Filled 2014-07-27 (×2): qty 1000

## 2014-07-27 MED ORDER — HYDROMORPHONE HCL 1 MG/ML IJ SOLN
0.2500 mg | INTRAMUSCULAR | Status: DC | PRN
  Administered 2014-07-27 (×2): 0.5 mg via INTRAVENOUS

## 2014-07-27 MED ORDER — SODIUM CHLORIDE 0.9 % IV SOLN
3.0000 g | Freq: Four times a day (QID) | INTRAVENOUS | Status: DC
  Administered 2014-07-27 (×4): 3 g via INTRAVENOUS
  Filled 2014-07-27 (×4): qty 3

## 2014-07-27 MED ORDER — SUCCINYLCHOLINE CHLORIDE 20 MG/ML IJ SOLN
INTRAMUSCULAR | Status: DC | PRN
Start: 2014-07-27 — End: 2014-07-27
  Administered 2014-07-27: 100 mg via INTRAVENOUS

## 2014-07-27 MED ORDER — BUPIVACAINE LIPOSOME 1.3 % IJ SUSP
20.0000 mL | Freq: Once | INTRAMUSCULAR | Status: AC
  Administered 2014-07-27: 15 mL
  Filled 2014-07-27: qty 20

## 2014-07-27 MED ORDER — HYDROMORPHONE HCL 1 MG/ML IJ SOLN
INTRAMUSCULAR | Status: AC
  Filled 2014-07-27: qty 1

## 2014-07-27 MED ORDER — GLYCOPYRROLATE 0.2 MG/ML IJ SOLN
INTRAMUSCULAR | Status: AC
  Filled 2014-07-27: qty 3

## 2014-07-27 MED ORDER — GLYCOPYRROLATE 0.2 MG/ML IJ SOLN
INTRAMUSCULAR | Status: DC | PRN
  Administered 2014-07-27: .4 mg via INTRAVENOUS

## 2014-07-27 MED ORDER — PIPERACILLIN-TAZOBACTAM 3.375 G IVPB
3.3750 g | Freq: Three times a day (TID) | INTRAVENOUS | Status: DC
  Administered 2014-07-27 – 2014-08-01 (×14): 3.375 g via INTRAVENOUS
  Filled 2014-07-27 (×15): qty 50

## 2014-07-27 MED ORDER — PROPOFOL 10 MG/ML IV BOLUS
INTRAVENOUS | Status: AC
  Filled 2014-07-27: qty 20

## 2014-07-27 MED ORDER — FENTANYL CITRATE (PF) 250 MCG/5ML IJ SOLN
INTRAMUSCULAR | Status: AC
  Filled 2014-07-27: qty 5

## 2014-07-27 MED ORDER — NEOSTIGMINE METHYLSULFATE 10 MG/10ML IV SOLN
INTRAVENOUS | Status: AC
  Filled 2014-07-27: qty 1

## 2014-07-27 MED ORDER — ACETAMINOPHEN 325 MG PO TABS: 650.0000 mg | ORAL_TABLET | Freq: Four times a day (QID) | ORAL | Status: DC | PRN

## 2014-07-27 MED ORDER — PROPOFOL 10 MG/ML IV BOLUS
INTRAVENOUS | Status: DC | PRN
  Administered 2014-07-27: 150 mg via INTRAVENOUS

## 2014-07-27 MED ORDER — MIDAZOLAM HCL 5 MG/5ML IJ SOLN
INTRAMUSCULAR | Status: DC | PRN
  Administered 2014-07-27: 2 mg via INTRAVENOUS

## 2014-07-27 MED ORDER — ONDANSETRON HCL 4 MG PO TABS
4.0000 mg | ORAL_TABLET | Freq: Four times a day (QID) | ORAL | Status: DC | PRN
  Administered 2014-07-30 – 2014-08-03 (×12): 4 mg via ORAL
  Filled 2014-07-27 (×12): qty 1

## 2014-07-27 MED ORDER — MEPERIDINE HCL 50 MG/ML IJ SOLN: 6.2500 mg | INTRAMUSCULAR | Status: DC | PRN

## 2014-07-27 MED ORDER — PROMETHAZINE HCL 25 MG/ML IJ SOLN
INTRAMUSCULAR | Status: AC
  Filled 2014-07-27: qty 1

## 2014-07-27 MED ORDER — DEXAMETHASONE SODIUM PHOSPHATE 10 MG/ML IJ SOLN
INTRAMUSCULAR | Status: AC
  Filled 2014-07-27: qty 1

## 2014-07-27 MED ORDER — ONDANSETRON HCL 4 MG/2ML IJ SOLN
4.0000 mg | Freq: Four times a day (QID) | INTRAMUSCULAR | Status: DC | PRN
  Administered 2014-07-28 – 2014-08-01 (×11): 4 mg via INTRAVENOUS
  Filled 2014-07-27 (×11): qty 2

## 2014-07-27 MED ORDER — HYDROMORPHONE HCL 1 MG/ML IJ SOLN
1.0000 mg | Freq: Once | INTRAMUSCULAR | Status: AC
  Administered 2014-07-27: 1 mg via INTRAVENOUS
  Filled 2014-07-27: qty 1

## 2014-07-27 MED ORDER — HEPARIN SODIUM (PORCINE) 5000 UNIT/ML IJ SOLN
5000.0000 [IU] | Freq: Three times a day (TID) | INTRAMUSCULAR | Status: DC
  Administered 2014-07-27 – 2014-08-03 (×20): 5000 [IU] via SUBCUTANEOUS
  Filled 2014-07-27 (×23): qty 1

## 2014-07-27 MED ORDER — ACETAMINOPHEN 650 MG RE SUPP: 650.0000 mg | Freq: Four times a day (QID) | RECTAL | Status: DC | PRN

## 2014-07-27 MED ORDER — MIDAZOLAM HCL 2 MG/2ML IJ SOLN
INTRAMUSCULAR | Status: AC
  Filled 2014-07-27: qty 2

## 2014-07-27 MED ORDER — SODIUM CHLORIDE 0.9 % IV SOLN
3.0000 g | Freq: Four times a day (QID) | INTRAVENOUS | Status: DC
  Filled 2014-07-27: qty 3

## 2014-07-27 MED ORDER — MORPHINE SULFATE 2 MG/ML IJ SOLN
1.0000 mg | INTRAMUSCULAR | Status: DC | PRN
  Administered 2014-07-27 – 2014-07-28 (×4): 1 mg via INTRAVENOUS
  Filled 2014-07-27 (×4): qty 1

## 2014-07-27 MED ORDER — METOCLOPRAMIDE HCL 5 MG/ML IJ SOLN
INTRAMUSCULAR | Status: AC
  Filled 2014-07-27: qty 2

## 2014-07-27 MED ORDER — LIDOCAINE HCL (CARDIAC) 20 MG/ML IV SOLN
INTRAVENOUS | Status: DC | PRN
  Administered 2014-07-27: 80 mg via INTRAVENOUS

## 2014-07-27 MED ORDER — METOCLOPRAMIDE HCL 5 MG/ML IJ SOLN
INTRAMUSCULAR | Status: DC | PRN
  Administered 2014-07-27: 10 mg via INTRAVENOUS

## 2014-07-27 MED ORDER — LACTATED RINGERS IV SOLN
INTRAVENOUS | Status: DC | PRN
  Administered 2014-07-27: 1000 mL

## 2014-07-27 MED ORDER — LACTATED RINGERS IV SOLN: INTRAVENOUS | Status: DC

## 2014-07-27 MED ORDER — ONDANSETRON HCL 4 MG/2ML IJ SOLN
4.0000 mg | Freq: Four times a day (QID) | INTRAMUSCULAR | Status: DC | PRN
  Administered 2014-07-27: 4 mg via INTRAVENOUS
  Filled 2014-07-27 (×2): qty 2

## 2014-07-27 MED ORDER — PROMETHAZINE HCL 25 MG/ML IJ SOLN
6.2500 mg | INTRAMUSCULAR | Status: DC | PRN
  Administered 2014-07-27: 12.5 mg via INTRAVENOUS

## 2014-07-27 MED ORDER — HYDROCODONE-ACETAMINOPHEN 5-325 MG PO TABS: 1.0000 | ORAL_TABLET | ORAL | Status: DC | PRN

## 2014-07-27 MED ORDER — KCL IN DEXTROSE-NACL 20-5-0.9 MEQ/L-%-% IV SOLN
INTRAVENOUS | Status: DC
  Administered 2014-07-27: 11:00:00 via INTRAVENOUS
  Filled 2014-07-27 (×2): qty 1000

## 2014-07-27 MED ORDER — ONDANSETRON HCL 4 MG/2ML IJ SOLN
INTRAMUSCULAR | Status: DC | PRN
Start: 2014-07-27 — End: 2014-07-27
  Administered 2014-07-27: 4 mg via INTRAVENOUS

## 2014-07-27 MED ORDER — DEXAMETHASONE SODIUM PHOSPHATE 10 MG/ML IJ SOLN
INTRAMUSCULAR | Status: DC | PRN
  Administered 2014-07-27: 10 mg via INTRAVENOUS

## 2014-07-27 MED ORDER — HYDROMORPHONE HCL 1 MG/ML IJ SOLN
1.0000 mg | INTRAMUSCULAR | Status: DC | PRN
  Administered 2014-07-27 (×3): 1 mg via INTRAVENOUS
  Filled 2014-07-27 (×4): qty 1

## 2014-07-27 MED ORDER — ROCURONIUM BROMIDE 100 MG/10ML IV SOLN
INTRAVENOUS | Status: AC
  Filled 2014-07-27: qty 1

## 2014-07-27 MED ORDER — FENTANYL CITRATE (PF) 100 MCG/2ML IJ SOLN: 25.0000 ug | INTRAMUSCULAR | Status: DC | PRN

## 2014-07-27 MED ORDER — HYDROCODONE-ACETAMINOPHEN 5-325 MG PO TABS
1.0000 | ORAL_TABLET | ORAL | Status: DC | PRN
  Administered 2014-07-28: 1 via ORAL
  Administered 2014-07-28: 2 via ORAL
  Administered 2014-07-28: 1 via ORAL
  Administered 2014-07-28 – 2014-08-02 (×20): 2 via ORAL
  Filled 2014-07-27 (×14): qty 2
  Filled 2014-07-27: qty 1
  Filled 2014-07-27 (×6): qty 2
  Filled 2014-07-27: qty 1
  Filled 2014-07-27: qty 2

## 2014-07-27 SURGICAL SUPPLY — 40 items
APPLIER CLIP ROT 10 11.4 M/L (STAPLE)
APR CLP MED LRG 11.4X10 (STAPLE)
BAG SPEC RTRVL 10 TROC 200 (ENDOMECHANICALS)
CABLE HIGH FREQUENCY MONO STRZ (ELECTRODE) ×3 IMPLANT
CLIP APPLIE ROT 10 11.4 M/L (STAPLE) IMPLANT
COVER SURGICAL LIGHT HANDLE (MISCELLANEOUS) ×3 IMPLANT
CUTTER FLEX LINEAR 45M (STAPLE) ×3 IMPLANT
DECANTER SPIKE VIAL GLASS SM (MISCELLANEOUS) IMPLANT
DRAIN CHANNEL 19F RND (DRAIN) ×3 IMPLANT
DRAPE LAPAROSCOPIC ABDOMINAL (DRAPES) ×3 IMPLANT
ELECT REM PT RETURN 9FT ADLT (ELECTROSURGICAL) ×3
ELECTRODE REM PT RTRN 9FT ADLT (ELECTROSURGICAL) ×1 IMPLANT
ENDOLOOP SUT PDS II  0 18 (SUTURE)
ENDOLOOP SUT PDS II 0 18 (SUTURE) IMPLANT
EVACUATOR SILICONE 100CC (DRAIN) ×3 IMPLANT
GLOVE BIOGEL M 8.0 STRL (GLOVE) ×3 IMPLANT
GOWN STRL REUS W/TWL XL LVL3 (GOWN DISPOSABLE) ×9 IMPLANT
KIT BASIN OR (CUSTOM PROCEDURE TRAY) ×3 IMPLANT
LIQUID BAND (GAUZE/BANDAGES/DRESSINGS) ×3 IMPLANT
POUCH RETRIEVAL ECOSAC 10 (ENDOMECHANICALS) IMPLANT
POUCH RETRIEVAL ECOSAC 10MM (ENDOMECHANICALS)
POUCH SPECIMEN RETRIEVAL 10MM (ENDOMECHANICALS) ×3 IMPLANT
RELOAD 45 VASCULAR/THIN (ENDOMECHANICALS) ×3 IMPLANT
RELOAD STAPLE TA45 3.5 REG BLU (ENDOMECHANICALS) IMPLANT
SCISSORS LAP 5X45 EPIX DISP (ENDOMECHANICALS) ×3 IMPLANT
SET IRRIG TUBING LAPAROSCOPIC (IRRIGATION / IRRIGATOR) ×3 IMPLANT
SHEARS CURVED HARMONIC AC 45CM (MISCELLANEOUS) ×3 IMPLANT
SLEEVE XCEL OPT CAN 5 100 (ENDOMECHANICALS) ×3 IMPLANT
SPONGE DRAIN TRACH 4X4 STRL 2S (GAUZE/BANDAGES/DRESSINGS) ×3 IMPLANT
STAPLER VISISTAT 35W (STAPLE) IMPLANT
SUT ETHILON 3 0 PS 1 (SUTURE) ×3 IMPLANT
SUT VIC AB 4-0 SH 18 (SUTURE) ×3 IMPLANT
SUT VICRYL 0 27 CT2 27 ABS (SUTURE) ×3 IMPLANT
TAPE CLOTH SURG 4X10 WHT LF (GAUZE/BANDAGES/DRESSINGS) ×3 IMPLANT
TOWEL OR 17X26 10 PK STRL BLUE (TOWEL DISPOSABLE) ×3 IMPLANT
TRAY FOLEY W/METER SILVER 14FR (SET/KITS/TRAYS/PACK) ×3 IMPLANT
TRAY LAPAROSCOPIC (CUSTOM PROCEDURE TRAY) ×3 IMPLANT
TROCAR BLADELESS OPT 5 100 (ENDOMECHANICALS) ×3 IMPLANT
TROCAR XCEL BLUNT TIP 100MML (ENDOMECHANICALS) ×3 IMPLANT
TROCAR XCEL NON-BLD 11X100MML (ENDOMECHANICALS) IMPLANT

## 2014-07-27 NOTE — Op Note (Signed)
Surgeon: Kaylyn Lim, MD, FACS  Asst:  none  Anes:  general  Procedure: Laparoscopic appendectomy for walled off ruptured appendix  Diagnosis: Ruptured appendix  Complications: none  EBL:   minimal cc  Drains: 9 Blake through the right sided 5 mm trocar site  Description of Procedure:  The patient was taken to OR 2 at [facility].  After anesthesia was administered and the patient was prepped a timeout was performed.  Access was achieved with a Hassan through the umbilicus.  Two more 5 mm trocars were inserted in the LLQ and the R mid abdomen.  The purulent walled off mass in the right lower quadrant involved the right tube and fallopian tube.  This was bluntly taken down with the suction tip and this enabled me to control contamination from this perforated and walled off appendix.  There was some pus and feculence noted in the abscess fluid.  Using the harmonic scalpel, I was able to isolate the base and I transected it at that level with a 4.5 stapler with a vascular load.  I irrigated with several hundred cc of fluid and placed a drain along the right gutter into the pelvis.  Exparel was injected into the incisions.  The drain was secured with a nylon and the fascia of the umbilicus approximated with two sutures of 0 vicryl.  The skin was closed with 4-0 vicryl and liquiban.    The patient tolerated the procedure well and was taken to the PACU in stable condition.     Matt B. Hassell Done, [place], [facility], [place]

## 2014-07-27 NOTE — Anesthesia Procedure Notes (Signed)
Date/Time: 07/27/2014 12:56 PM Performed by: Sherian Maroon A Pre-anesthesia Checklist: Patient identified, Emergency Drugs available, Suction available and Patient being monitored Patient Re-evaluated:Patient Re-evaluated prior to inductionOxygen Delivery Method: Circle system utilized Preoxygenation: Pre-oxygenation with 100% oxygen Intubation Type: IV induction Tube size: 7.5 mm Number of attempts: 1 Airway Equipment and Method: Stylet Placement Confirmation: ETT inserted through vocal cords under direct vision,  positive ETCO2 and breath sounds checked- equal and bilateral Secured at: 22 cm Tube secured with: Tape Dental Injury: Teeth and Oropharynx as per pre-operative assessment

## 2014-07-27 NOTE — H&P (Signed)
Ellen Hayden is an 49 y.o. female.    General Surgery [facility]  Chief Complaint: abdominal pain, acute appendicitis  HPI: patient is a 49 yo WF with 24 hour hx of abdominal pain localizing to the RLQ.  Mild nausea, no emesis.  Denies fever or chills.  Had small BM after laxative.  Presents to ER.  Labs with WBC 21.9K.  CTA positive acute appendicitis with appendicolith, no abscess.  Prior C-sections, pediatric hernia repair.  Allergy to Cipro.  Past Medical History  Diagnosis Date  . Cervical dysplasia 2000    mild dysplasia with no treatment and normal paps since    Past Surgical History  Procedure Laterality Date  . Eye surgery  1970    STRABISMUS  . Hernia repair  1968  . Cesarean section  2007    UNFAVORABLE CERVIX-4200 GRAMS  . Cesarean section  2009  . Colposcopy      Family History  Problem Relation Age of Onset  . Hypertension Father   . Cancer Maternal Grandmother     THROAT - SMOKER  . Cancer Paternal Grandmother     LIVER   Social History:  reports that she has never smoked. She has never used smokeless tobacco. She reports that she drinks about 1.8 oz of alcohol per week. She reports that she does not use illicit drugs.  Allergies:  Allergies  Allergen Reactions  . Ciprofloxacin     REACTION: rash when had fever     (Not in a hospital admission)  Results for orders placed or performed during the hospital encounter of 07/26/14 (from the past 48 hour(s))  Lipase, blood     Status: None   Collection Time: 07/26/14  7:32 PM  Result Value Ref Range   Lipase 22 22 - 51 U/L  Comprehensive metabolic panel     Status: Abnormal   Collection Time: 07/26/14  7:32 PM  Result Value Ref Range   Sodium 135 135 - 145 mmol/L   Potassium 3.6 3.5 - 5.1 mmol/L   Chloride 102 101 - 111 mmol/L   CO2 23 22 - 32 mmol/L   Glucose, Bld 103 (H) 65 - 99 mg/dL   BUN 12 6 - 20 mg/dL   Creatinine, Ser 0.79 0.44 - 1.00 mg/dL   Calcium 8.7 (L)  8.9 - 10.3 mg/dL   Total Protein 7.9 6.5 - 8.1 g/dL   Albumin 4.1 3.5 - 5.0 g/dL   AST 14 (L) 15 - 41 U/L   ALT 14 14 - 54 U/L   Alkaline Phosphatase 48 38 - 126 U/L   Total Bilirubin 1.2 0.3 - 1.2 mg/dL   GFR calc non Af Amer >60 >60 mL/min   GFR calc Af Amer >60 >60 mL/min    Comment: (NOTE) The eGFR has been calculated using the CKD EPI equation. This calculation has not been validated in all clinical situations. eGFR's persistently <60 mL/min signify possible Chronic Kidney Disease.    Anion gap 10 5 - 15  CBC     Status: Abnormal   Collection Time: 07/26/14  7:32 PM  Result Value Ref Range   WBC 21.9 (H) 4.0 - 10.5 K/uL   RBC 4.19 3.87 - 5.11 MIL/uL   Hemoglobin 12.8 12.0 - 15.0 g/dL   HCT 37.5 36.0 - 46.0 %   MCV 89.5 78.0 - 100.0 fL   MCH 30.5 26.0 - 34.0 pg   MCHC 34.1 30.0 - 36.0 g/dL   RDW  14.0 11.5 - 15.5 %   Platelets 234 150 - 400 K/uL  I-Stat beta hCG blood, ED ([facility], [facility], [facility] only)     Status: None   Collection Time: 07/26/14  7:37 PM  Result Value Ref Range   I-stat hCG, quantitative <5.0 <5 mIU/mL   Comment 3            Comment:   GEST. AGE      CONC.  (mIU/mL)   <=1 WEEK        5 - 50     2 WEEKS       50 - 500     3 WEEKS       100 - 10,000     4 WEEKS     1,000 - 30,000        FEMALE AND NON-PREGNANT FEMALE:     LESS THAN 5 mIU/mL   Urinalysis, Routine w reflex microscopic (not at [facility])     Status: Abnormal   Collection Time: 07/26/14 10:10 PM  Result Value Ref Range   Color, Urine AMBER (A) YELLOW    Comment: BIOCHEMICALS MAY BE AFFECTED BY COLOR   APPearance CLEAR CLEAR   Specific Gravity, Urine 1.027 1.005 - 1.030   pH 7.0 5.0 - 8.0   Glucose, UA NEGATIVE NEGATIVE mg/dL   Hgb urine dipstick NEGATIVE NEGATIVE   Bilirubin Urine NEGATIVE NEGATIVE   Ketones, ur >80 (A) NEGATIVE mg/dL   Protein, ur NEGATIVE NEGATIVE mg/dL   Urobilinogen, UA 1.0 0.0 - 1.0 mg/dL   Nitrite NEGATIVE NEGATIVE   Leukocytes, UA NEGATIVE NEGATIVE    Comment: MICROSCOPIC  NOT DONE ON URINES WITH NEGATIVE PROTEIN, BLOOD, LEUKOCYTES, NITRITE, OR GLUCOSE <1000 mg/dL.   Ct Abdomen Pelvis W Contrast  07/26/2014   CLINICAL DATA:  Lower abdominal pain starting yesterday and extending from the belly button to the lower abdomen. Poor oral intake. Nausea. Bowel movement today.  EXAM: CT ABDOMEN AND PELVIS WITH CONTRAST  TECHNIQUE: Multidetector CT imaging of the abdomen and pelvis was performed using the standard protocol following bolus administration of intravenous contrast.  CONTRAST:  153m OMNIPAQUE IOHEXOL 300 MG/ML  SOLN  COMPARISON:  MRI abdomen 03/29/2009.  FINDINGS: Mild dependent changes in the lung bases.  Low-attenuation lesions scattered throughout the liver, largest measuring 2.1 cm diameter. These were shoulder represent cysts and hemangiomas on prior MRI. Small focal area of fatty infiltration adjacent to the falciform ligament. The gallbladder, spleen, pancreas, adrenal glands, kidneys, abdominal aorta, inferior vena cava, and retroperitoneal lymph nodes are unremarkable. Stomach is decompressed. Small bowel and colon are not abnormally distended. No free air or free fluid in the abdomen.  Pelvis: The appendix is diffusely distended, measuring up to about 11 mm diameter. An appendicolith is present at the base. There is inflammatory stranding in the right lower quadrant and around the appendix. Changes are consistent with acute appendicitis. No abscess identified. 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 Bladder is decompressed. No abnormal adnexal masses. No significant lymphadenopathy. No destructive bone lesions.  IMPRESSION: At appendicolith and distended appendix with inflammatory changes in the right lower quadrant consistent with acute appendicitis. No abscess.   Electronically Signed   By: WLucienne CapersM.D.   On: 07/26/2014 23:50    Review of Systems  Constitutional: Negative.   HENT: Negative.   Eyes: Negative.   Respiratory: Negative.    Cardiovascular: Negative.   Gastrointestinal: Positive for nausea and abdominal pain (generalized, now RLQ). Negative for heartburn, vomiting, diarrhea,  constipation, blood in stool and melena.  Genitourinary: Negative.   Musculoskeletal: Negative.   Skin: Negative.   Neurological: Negative.   Endo/Heme/Allergies: Negative.   Psychiatric/Behavioral: Negative.     Blood pressure 125/72, pulse 94, temperature 99.9 F (37.7 C), temperature source Oral, resp. rate 18, last menstrual period 05/05/2014, SpO2 100 %. Physical Exam  Constitutional: She is oriented to person, place, and time. She appears well-developed and well-nourished. No distress.  HENT:  Head: Normocephalic and atraumatic.  Right Ear: External ear normal.  Left Ear: External ear normal.  Eyes: Conjunctivae are normal. Pupils are equal, round, and reactive to light. No scleral icterus.  Neck: Normal range of motion. Neck supple. No tracheal deviation present. No thyromegaly present.  Cardiovascular: Normal rate, regular rhythm and normal heart sounds.   No murmur heard. Respiratory: Effort normal and breath sounds normal. No respiratory distress. She has no wheezes.  GI: Soft. Bowel sounds are normal. She exhibits no distension and no mass. There is tenderness (mild generalized tenderness, max in RLQ). There is guarding. There is no rebound.  Musculoskeletal: Normal range of motion. She exhibits no edema.  Lymphadenopathy:    She has no cervical adenopathy.  Neurological: She is alert and oriented to person, place, and time.  Skin: Skin is warm and dry. She is not diaphoretic.  Psychiatric: She has a normal mood and affect. Her behavior is normal.     Assessment/Plan Acute appendicitis  Admit to surgical service  Begin IV Unasyn  Keep NPO  IV hydration  Plan lap appendectomy in the AM by Dr. Kaylyn Lim.  Case posted with OR.  The risks and benefits of the procedure have been discussed at length with the patient.   The patient understands the proposed procedure, potential alternative treatments, and the course of recovery to be expected.  All of the patient's questions have been answered at this time.  The patient wishes to proceed with surgery.  Earnstine Regal, MD, [facility] Office: [place] 07/27/2014, 12:50 AM

## 2014-07-27 NOTE — Transfer of Care (Signed)
Immediate Anesthesia Transfer of Care Note  Patient: Ellen Hayden  Procedure(s) Performed: Procedure(s): APPENDECTOMY LAPAROSCOPIC (N/A)  Patient Location: PACU  Anesthesia Type:General  Level of Consciousness: awake, alert , oriented and patient cooperative  Airway & Oxygen Therapy: Patient Spontanous Breathing and Patient connected to face mask oxygen  Post-op Assessment: Report given to RN and Post -op Vital signs reviewed and stable  Post vital signs: Reviewed and stable  Last Vitals:  Filed Vitals:   07/27/14 1430  BP: 100/58  Pulse: 84  Temp:   Resp: 15    Complications: No apparent anesthesia complications

## 2014-07-27 NOTE — Anesthesia Preprocedure Evaluation (Addendum)
Anesthesia Evaluation  Patient identified by MRN, date of birth, ID band Patient awake    Reviewed: Allergy & Precautions, NPO status , Patient's Chart, lab work & pertinent test results  Airway Mallampati: II  TM Distance: >3 FB Neck ROM: Full    Dental no notable dental hx.    Pulmonary neg pulmonary ROS,  breath sounds clear to auscultation  Pulmonary exam normal       Cardiovascular negative cardio ROS Normal cardiovascular examRhythm:Regular Rate:Normal     Neuro/Psych negative neurological ROS  negative psych ROS   GI/Hepatic negative GI ROS, Neg liver ROS,   Endo/Other  negative endocrine ROS  Renal/GU negative Renal ROS  negative genitourinary   Musculoskeletal negative musculoskeletal ROS (+)   Abdominal   Peds negative pediatric ROS (+)  Hematology negative hematology ROS (+)   Anesthesia Other Findings   Reproductive/Obstetrics negative OB ROS                             Anesthesia Physical Anesthesia Plan  ASA: I  Anesthesia Plan: General   Post-op Pain Management:    Induction: Intravenous  Airway Management Planned: Oral ETT  Additional Equipment:   Intra-op Plan:   Post-operative Plan: Extubation in OR  Informed Consent: I have reviewed the patients History and Physical, chart, labs and discussed the procedure including the risks, benefits and alternatives for the proposed anesthesia with the patient or authorized representative who has indicated his/her understanding and acceptance.   Dental advisory given  Plan Discussed with: CRNA  Anesthesia Plan Comments:         Anesthesia Quick Evaluation

## 2014-07-27 NOTE — Interval H&P Note (Signed)
History and Physical Interval Note:  07/27/2014 12:48 PM  Ellen Hayden  has presented today for surgery, with the diagnosis of Acute Apendicitis  The various methods of treatment have been discussed with the patient and family. After consideration of risks, benefits and other options for treatment, the patient has consented to  Procedure(s): APPENDECTOMY LAPAROSCOPIC (N/A) as a surgical intervention .  The patient's history has been reviewed, patient examined, no change in status, stable for surgery.  I have reviewed the patient's chart and labs.  Questions were answered to the patient's satisfaction.     Ellen Hayden

## 2014-07-27 NOTE — Discharge Summary (Signed)
Physician Discharge Summary  Patient ID: Ellen Hayden MRN: [id number] DOB/AGE: 48-Sep-1967 48 y.o.  PCP:  Lottie Dawson, MD   Admit date: 07/26/2014 Discharge date: 08/03/2014  Admission Diagnoses:  Acute appendicitis Hx of C section and Hernia repair Discharge Diagnoses:   Acute appendicitis Dehydration Hx of C section and Hernia repair Post op ileus  Principal Problem:   Ruptured appendicitis   PROCEDURES: Laparoscopic Appendectomy, 07/27/14, Dr. Lynn Ito Course: patient is a 49 yo WF with 24 hour hx of abdominal pain localizing to the RLQ. Mild nausea, no emesis. Denies fever or chills. Had small BM after laxative. Presents to ER. Labs with WBC 21.9K. CTA positive acute appendicitis with appendicolith, no abscess.  Pt was seen and admitted to the hospital by DR. Gerkin.  She was stable overnight and taken to the OR the following day.  In the [place] found her appendix had ruptured and was walled off.  "The purulent walled off mass in the right lower quadrant involved the right tube and fallopian tube. This was bluntly taken down with the suction tip and this enabled me to control contamination from this perforated and walled off appendix. There was some pus and feculence noted in the abscess fluid."  Post op she had some fever and ileus.  A drain was left in place and this was initially cloudy fluid.  Her diet was slowly advanced, her fluid cleared and her ileus slowly resolved.  She has some baseline constipation issues.  She had some post op nausea also and was slow to take PO's.  By 08/03/14 she was better.  WBC on 7/18 was 8.3, she had a small BM after suppository and was ready for discharge.  We will give her 3 more days and a total 10 days of antibiotics.  Follow up as noted below.   Condition on D/c:  Improved   Disposition:     Medication List    TAKE these medications        acetaminophen 325 MG tablet  Commonly known as:  TYLENOL   Take 2 tablets (650 mg total) by mouth every 6 (six) hours as needed for mild pain, moderate pain, fever or headache.     amoxicillin-clavulanate 875-125 MG per tablet  Commonly known as:  AUGMENTIN  Take 1 tablet by mouth every 12 (twelve) hours.     ibuprofen 200 MG tablet  Commonly known as:  MOTRIN IB  You can take 2-3 tablets every 6 hours as needed for pain safely.     norethindrone-ethinyl estradiol 1-20 MG-MCG tablet  Commonly known as:  MICROGESTIN,JUNEL,LOESTRIN  Take 1 tablet by mouth daily.     JUNEL 1/20 1-20 MG-MCG tablet  Generic drug:  norethindrone-ethinyl estradiol  TAKE 1 TABLET BY MOUTH DAILY.     oxyCODONE-acetaminophen 5-325 MG per tablet  Commonly known as:  PERCOCET/ROXICET  Take 1-2 tablets by mouth every 4 (four) hours as needed for moderate pain.     saccharomyces boulardii 250 MG capsule  Commonly known as:  FLORASTOR  Take 1 twice a day for at least a week after you complete your antibiotics.  You can buy this over the counter at any drug store.     ZYRTEC ALLERGY PO  Take 1 tablet by mouth daily.       Follow-up Information    Follow up with Lottie Dawson, MD.   Specialties:  Internal Medicine, Pediatrics   Why:  As needed   Contact information:  [address] [place] [address] [phone]       Follow up with [facility] OFFICE [place] On 08/17/2014.   Why:  Your follow up is with the [place] clinic at 2 PM, be there 30 minutes early for check in.   Contact information:   [place] [address] [phone]      Signed: Earnstine Regal 08/03/2014, 8:06 AM

## 2014-07-27 NOTE — Progress Notes (Signed)
Subjective: She got sick Sunday, and really didn't eat or drink much yesterday because she felt so bad, reports urine is dark and as noted not much.  I am going to give her a fluid bolus and up her rate.  She is for surgery today currently next case.  Objective: Vital signs in last 24 hours: Temp:  [97.7 F (36.5 C)-99.9 F (37.7 C)] 97.7 F (36.5 C) (07/12 0604) Pulse Rate:  [69-99] 69 (07/12 0604) Resp:  [16-18] 16 (07/12 0604) BP: (102-125)/(59-77) 106/64 mmHg (07/12 0604) SpO2:  [98 %-100 %] 100 % (07/12 0604) Weight:  [71.623 kg (157 lb 14.4 oz)] 71.623 kg (157 lb 14.4 oz) (07/12 0244) Last BM Date: 07/26/14 200 urine recorded NPO TM 99.9 No labs this AM, WBC 21.9 last PM Intake/Output from previous day: 07/11 0701 - 07/12 0700 In: 579.2 [I.V.:579.2] Out: 200 [Urine:200] Intake/Output this shift:    General appearance: alert, cooperative and no distress GI: tender, some nausea earlier but better now.  Pain RLQ.  Lab Results:   Recent Labs  07/26/14 1932  WBC 21.9*  HGB 12.8  HCT 37.5  PLT 234    BMET  Recent Labs  07/26/14 1932  NA 135  K 3.6  CL 102  CO2 23  GLUCOSE 103*  BUN 12  CREATININE 0.79  CALCIUM 8.7*   PT/INR No results for input(s): LABPROT, INR in the last 72 hours.   Recent Labs Lab 07/26/14 1932  AST 14*  ALT 14  ALKPHOS 48  BILITOT 1.2  PROT 7.9  ALBUMIN 4.1     Lipase     Component Value Date/Time   LIPASE 22 07/26/2014 1932     Studies/Results: Ct Abdomen Pelvis W Contrast  07/26/2014   CLINICAL DATA:  Lower abdominal pain starting yesterday and extending from the belly button to the lower abdomen. Poor oral intake. Nausea. Bowel movement today.  EXAM: CT ABDOMEN AND PELVIS WITH CONTRAST  TECHNIQUE: Multidetector CT imaging of the abdomen and pelvis was performed using the standard protocol following bolus administration of intravenous contrast.  CONTRAST:  178mL OMNIPAQUE IOHEXOL 300 MG/ML  SOLN  COMPARISON:  MRI  abdomen 03/29/2009.  FINDINGS: Mild dependent changes in the lung bases.  Low-attenuation lesions scattered throughout the liver, largest measuring 2.1 cm diameter. These were shoulder represent cysts and hemangiomas on prior MRI. Small focal area of fatty infiltration adjacent to the falciform ligament. The gallbladder, spleen, pancreas, adrenal glands, kidneys, abdominal aorta, inferior vena cava, and retroperitoneal lymph nodes are unremarkable. Stomach is decompressed. Small bowel and colon are not abnormally distended. No free air or free fluid in the abdomen.  Pelvis: The appendix is diffusely distended, measuring up to about 11 mm diameter. An appendicolith is present at the base. There is inflammatory stranding in the right lower quadrant and around the appendix. Changes are consistent with acute appendicitis. No abscess identified. 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 Bladder is decompressed. No abnormal adnexal masses. No significant lymphadenopathy. No destructive bone lesions.  IMPRESSION: At appendicolith and distended appendix with inflammatory changes in the right lower quadrant consistent with acute appendicitis. No abscess.   Electronically Signed   By: Lucienne Capers M.D.   On: 07/26/2014 23:50    Medications: . ampicillin-sulbactam (UNASYN) IV  3 g Intravenous Q6H  . mupirocin ointment  1 application Nasal BID   . dextrose 5 % and 0.45 % NaCl with KCl 20 mEq/L 125 mL/hr at 07/27/14 0600   Prior to Admission medications  Medication Sig Start Date End Date Taking? Authorizing Provider  Cetirizine HCl (ZYRTEC ALLERGY PO) Take 1 tablet by mouth daily.   Yes Historical Provider, MD  JUNEL 1/20 1-20 MG-MCG tablet TAKE 1 TABLET BY MOUTH DAILY. Patient taking differently: Take 1 tablet by mouth daily. 07/12/14  Yes Anastasio Auerbach, MD  norethindrone-ethinyl estradiol (MICROGESTIN,JUNEL,LOESTRIN) 1-20 MG-MCG tablet Take 1 tablet by mouth daily. Patient not taking: Reported on  07/26/2014 07/07/14   Anastasio Auerbach, MD     Assessment/Plan  Acute appendicitis Hx of C section and Hernia repair Antibiotics: Unasyn started early AM last dose 0628 DVT:  SCD/no heparin preop   Plan:  Fluid bolus now and up rate, then surgery this AM.  Recheck labs in AM.       Earnstine Regal 07/27/2014

## 2014-07-27 NOTE — Progress Notes (Signed)
ANTIBIOTIC CONSULT NOTE - INITIAL  Pharmacy Consult for Zosyn Indication: S/p appendectomy for walled off ruptured appendix  Allergies  Allergen Reactions  . Ciprofloxacin     REACTION: rash when had fever    Patient Measurements: Height: 5\' 3"  (160 cm) Weight: 157 lb 14.4 oz (71.623 kg) IBW/kg (Calculated) : 52.4   Vital Signs: Temp: 98 F (36.7 C) (07/12 1630) Temp Source: Oral (07/12 1630) BP: 110/60 mmHg (07/12 1630) Pulse Rate: 71 (07/12 1630) Intake/Output from previous day: 07/11 0701 - 07/12 0700 In: 579.2 [I.V.:579.2] Out: 200 [Urine:200] Intake/Output from this shift: Total I/O In: 2656.3 [I.V.:2256.3; IV Piggyback:400] Out: 345 [Urine:250; Drains:70; Blood:25]  Labs:  Recent Labs  07/26/14 1932  WBC 21.9*  HGB 12.8  PLT 234  CREATININE 0.79   Estimated Creatinine Clearance: 81.6 mL/min (by C-G formula based on Cr of 0.79). No results for input(s): VANCOTROUGH, VANCOPEAK, VANCORANDOM, GENTTROUGH, GENTPEAK, GENTRANDOM, TOBRATROUGH, TOBRAPEAK, TOBRARND, AMIKACINPEAK, AMIKACINTROU, AMIKACIN in the last 72 hours.   Microbiology: Recent Results (from the past 720 hour(s))  Surgical pcr screen     Status: Abnormal   Collection Time: 07/27/14  3:04 AM  Result Value Ref Range Status   MRSA, PCR NEGATIVE NEGATIVE Final   Staphylococcus aureus POSITIVE (A) NEGATIVE Final    Comment:        The Xpert SA Assay (FDA approved for NASAL specimens in patients over 56 years of age), is one component of a comprehensive surveillance program.  Test performance has been validated by [facility] for patients greater than or equal to 9 year old. It is not intended to diagnose infection nor to guide or monitor treatment.     Medical History: Past Medical History  Diagnosis Date  . Cervical dysplasia 2000    mild dysplasia with no treatment and normal paps since    Medications:  Prescriptions prior to admission  Medication Sig Dispense Refill Last Dose   . Cetirizine HCl (ZYRTEC ALLERGY PO) Take 1 tablet by mouth daily.   07/26/2014 at Unknown time  . JUNEL 1/20 1-20 MG-MCG tablet TAKE 1 TABLET BY MOUTH DAILY. (Patient taking differently: Take 1 tablet by mouth daily.) 84 tablet 3 07/26/2014 at Unknown time  . norethindrone-ethinyl estradiol (MICROGESTIN,JUNEL,LOESTRIN) 1-20 MG-MCG tablet Take 1 tablet by mouth daily. (Patient not taking: Reported on 07/26/2014) 4 Package 3    Scheduled:  . heparin subcutaneous  5,000 Units Subcutaneous 3 times per day  . HYDROmorphone      . piperacillin-tazobactam (ZOSYN)  IV  3.375 g Intravenous Q8H  . promethazine       Infusions:  . dextrose 5 % and 0.45 % NaCl with KCl 20 mEq/L 100 mL/hr at 07/27/14 1630   Assessment: 49 yoF s/p appendectomy.  MD changing Unasyn to Zosyn per Rx.   Goal of Therapy:  Treat infection  Plan:    Zosyn 3.375 Gm IV q8h EI  F/u SCr/cultures   Lawana Pai R 07/27/2014,5:00 PM

## 2014-07-27 NOTE — Anesthesia Postprocedure Evaluation (Signed)
  Anesthesia Post-op Note  Patient: Ellen Hayden  Procedure(s) Performed: Procedure(s) (LRB): APPENDECTOMY LAPAROSCOPIC (N/A)  Patient Location: PACU  Anesthesia Type: General  Level of Consciousness: awake and alert   Airway and Oxygen Therapy: Patient Spontanous Breathing  Post-op Pain: mild  Post-op Assessment: Post-op Vital signs reviewed, Patient's Cardiovascular Status Stable, Respiratory Function Stable, Patent Airway and No signs of Nausea or vomiting  Last Vitals:  Filed Vitals:   07/27/14 1509  BP:   Pulse: 64  Temp:   Resp: 13    Post-op Vital Signs: stable   Complications: No apparent anesthesia complications

## 2014-07-28 ENCOUNTER — Encounter (HOSPITAL_COMMUNITY): Payer: Self-pay | Admitting: Surgery

## 2014-07-28 LAB — CBC
HCT: 31.3 % — ABNORMAL LOW (ref 36.0–46.0)
HEMOGLOBIN: 10.6 g/dL — AB (ref 12.0–15.0)
MCH: 30.6 pg (ref 26.0–34.0)
MCHC: 33.9 g/dL (ref 30.0–36.0)
MCV: 90.5 fL (ref 78.0–100.0)
Platelets: 207 10*3/uL (ref 150–400)
RBC: 3.46 MIL/uL — ABNORMAL LOW (ref 3.87–5.11)
RDW: 14 % (ref 11.5–15.5)
WBC: 20.7 10*3/uL — ABNORMAL HIGH (ref 4.0–10.5)

## 2014-07-28 NOTE — Progress Notes (Signed)
1 Day Post-Op  Subjective: She is annoyed with the IV beeping.  No flatus or BS she is taking some liquids and PO meds, but she has very rare bowel sounds and I expect her to have an ileus.  She has cloudy drainage coming from the drain.    Objective: Vital signs in last 24 hours: Temp:  [97.8 F (36.6 C)-101.3 F (38.5 C)] 98.6 F (37 C) (07/13 0603) Pulse Rate:  [63-100] 83 (07/13 0603) Resp:  [12-19] 16 (07/13 0603) BP: (100-126)/(56-75) 106/68 mmHg (07/13 0603) SpO2:  [97 %-100 %] 100 % (07/13 0603) Last BM Date: 07/26/14 240 PO  Diet:  Full liquids 1250 urine 250 from drain TM 101.3, temp down this AM to 98.6 WBC up to 20.7K Intake/Output from previous day: 07/12 0701 - 07/13 0700 In: 4351.3 [P.O.:240; I.V.:3611.3; IV Piggyback:500] Out: 1525 [Urine:1250; Drains:250; Blood:25] Intake/Output this shift:    General appearance: alert, cooperative, no distress and mostly annoyed. Resp: clear to auscultation bilaterally GI: soft, sore, no bowel sounds.  Sites all look fine..  Drainage is serous and purulent in appearance.  Lab Results:   Recent Labs  07/27/14 1630 07/28/14 0405  WBC 13.7* 20.7*  HGB 11.2* 10.6*  HCT 33.3* 31.3*  PLT 207 207    BMET  Recent Labs  07/26/14 1932 07/27/14 1630  NA 135  --   K 3.6  --   CL 102  --   CO2 23  --   GLUCOSE 103*  --   BUN 12  --   CREATININE 0.79 0.78  CALCIUM 8.7*  --    PT/INR No results for input(s): LABPROT, INR in the last 72 hours.   Recent Labs Lab 07/26/14 1932  AST 14*  ALT 14  ALKPHOS 48  BILITOT 1.2  PROT 7.9  ALBUMIN 4.1     Lipase     Component Value Date/Time   LIPASE 22 07/26/2014 1932     Studies/Results: Ct Abdomen Pelvis W Contrast  07/26/2014   CLINICAL DATA:  Lower abdominal pain starting yesterday and extending from the belly button to the lower abdomen. Poor oral intake. Nausea. Bowel movement today.  EXAM: CT ABDOMEN AND PELVIS WITH CONTRAST  TECHNIQUE: Multidetector CT  imaging of the abdomen and pelvis was performed using the standard protocol following bolus administration of intravenous contrast.  CONTRAST:  141mL OMNIPAQUE IOHEXOL 300 MG/ML  SOLN  COMPARISON:  MRI abdomen 03/29/2009.  FINDINGS: Mild dependent changes in the lung bases.  Low-attenuation lesions scattered throughout the liver, largest measuring 2.1 cm diameter. These were shoulder represent cysts and hemangiomas on prior MRI. Small focal area of fatty infiltration adjacent to the falciform ligament. The gallbladder, spleen, pancreas, adrenal glands, kidneys, abdominal aorta, inferior vena cava, and retroperitoneal lymph nodes are unremarkable. Stomach is decompressed. Small bowel and colon are not abnormally distended. No free air or free fluid in the abdomen.  Pelvis: The appendix is diffusely distended, measuring up to about 11 mm diameter. An appendicolith is present at the base. There is inflammatory stranding in the right lower quadrant and around the appendix. Changes are consistent with acute appendicitis. No abscess identified. 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 Bladder is decompressed. No abnormal adnexal masses. No significant lymphadenopathy. No destructive bone lesions.  IMPRESSION: At appendicolith and distended appendix with inflammatory changes in the right lower quadrant consistent with acute appendicitis. No abscess.   Electronically Signed   By: Lucienne Capers M.D.   On: 07/26/2014 23:50  Medications: . heparin subcutaneous  5,000 Units Subcutaneous 3 times per day  . piperacillin-tazobactam (ZOSYN)  IV  3.375 g Intravenous Q8H   . dextrose 5 % and 0.45 % NaCl with KCl 20 mEq/L 100 mL/hr at 07/27/14 1630    Assessment/Plan  Acute appendicitis with rupture S/p Laparoscopic appendectomy for walled off ruptured appendix,(right ovary and fallopian tube) 07/27/14, Dr. Hassell Done POD # 1 Hx of C section and Hernia repair Antibiotics: Unasyn started early AM 7/12, switched to  Zosyn PM 07/27/14, after surgery DVT: SCD/heparin  Plan:  Go slow on PO's, continue antibiotics, continue hydration.  She has a hard time with blood draws so I will hold off until Friday to recheck labs.  I told her to expect a week of IV antibiotics.      LOS: 1 day    Jeric Slagel 07/28/2014

## 2014-07-29 NOTE — Progress Notes (Signed)
2 Days Post-Op  Subjective: She is feeling better, no flatus so far.  She got up and walked once yesterday.  Taking clears without issue so far.  Port sites all look good drainage is clearer, serous colored fluid now.  Objective: Vital signs in last 24 hours: Temp:  [98.1 F (36.7 C)-98.9 F (37.2 C)] 98.1 F (36.7 C) (07/14 0518) Pulse Rate:  [76-92] 84 (07/14 0518) Resp:  [16-18] 16 (07/14 0518) BP: (97-127)/(63-76) 97/67 mmHg (07/14 0518) SpO2:  [100 %] 100 % (07/14 0518) Last BM Date: 07/26/14 PO 840 Drain 215 ml Afebrile since post op spike to 101.3 No labs today Intake/Output from previous day: 07/13 0701 - 07/14 0700 In: 2418.3 [P.O.:840; I.V.:1578.3] Out: 1565 [Urine:1350; Drains:215] Intake/Output this shift:    General appearance: alert, cooperative and no distress Resp: clear to auscultation bilaterally GI: soft, still sore RLQ and around the drain site.  Drainage is less purulent today.    Lab Results:   Recent Labs  07/27/14 1630 07/28/14 0405  WBC 13.7* 20.7*  HGB 11.2* 10.6*  HCT 33.3* 31.3*  PLT 207 207    BMET  Recent Labs  07/26/14 1932 07/27/14 1630  NA 135  --   K 3.6  --   CL 102  --   CO2 23  --   GLUCOSE 103*  --   BUN 12  --   CREATININE 0.79 0.78  CALCIUM 8.7*  --    PT/INR No results for input(s): LABPROT, INR in the last 72 hours.   Recent Labs Lab 07/26/14 1932  AST 14*  ALT 14  ALKPHOS 48  BILITOT 1.2  PROT 7.9  ALBUMIN 4.1     Lipase     Component Value Date/Time   LIPASE 22 07/26/2014 1932     Studies/Results: No results found.  Medications: . heparin subcutaneous  5,000 Units Subcutaneous 3 times per day  . piperacillin-tazobactam (ZOSYN)  IV  3.375 g Intravenous Q8H    Assessment/Plan Acute appendicitis with rupture S/p Laparoscopic appendectomy for walled off ruptured appendix,(right ovary and fallopian tube) 07/27/14, Dr. Hassell Done POD # 1 Hx of C section and Hernia repair Antibiotics: Unasyn  started early AM 7/12, switched to Zosyn PM 07/27/14, after surgery  Day 2.5 Zosyn DVT: SCD/heparin   Plan:  She is on full liquids, but still just taking clears, I told her to up it as she feels comfortable.  Increase walking today, continue antibiotics, continue fluids, and recheck labs in AM.    LOS: 2 days    Ellen Hayden 07/29/2014

## 2014-07-30 LAB — BASIC METABOLIC PANEL
Anion gap: 4 — ABNORMAL LOW (ref 5–15)
BUN: 14 mg/dL (ref 6–20)
CO2: 26 mmol/L (ref 22–32)
Calcium: 7.5 mg/dL — ABNORMAL LOW (ref 8.9–10.3)
Chloride: 104 mmol/L (ref 101–111)
Creatinine, Ser: 0.75 mg/dL (ref 0.44–1.00)
GFR calc Af Amer: >60 mL/min (ref >60)
GFR calc non Af Amer: >60 mL/min (ref >60)
Glucose, Bld: 96 mg/dL (ref 65–99)
Potassium: 3.8 mmol/L (ref 3.5–5.1)
SODIUM: 134 mmol/L — AB (ref 135–145)

## 2014-07-30 LAB — CBC
HCT: 29.8 % — ABNORMAL LOW (ref 36.0–46.0)
Hemoglobin: 10 g/dL — ABNORMAL LOW (ref 12.0–15.0)
MCH: 30 pg (ref 26.0–34.0)
MCHC: 33.6 g/dL (ref 30.0–36.0)
MCV: 89.5 fL (ref 78.0–100.0)
PLATELETS: 222 10*3/uL (ref 150–400)
RBC: 3.33 MIL/uL — AB (ref 3.87–5.11)
RDW: 14 % (ref 11.5–15.5)
WBC: 10.3 10*3/uL (ref 4.0–10.5)

## 2014-07-30 MED ORDER — ACETAMINOPHEN 325 MG PO TABS: 650.0000 mg | ORAL_TABLET | Freq: Four times a day (QID) | ORAL | Status: DC | PRN

## 2014-07-30 MED ORDER — SACCHAROMYCES BOULARDII 250 MG PO CAPS
250.0000 mg | ORAL_CAPSULE | Freq: Two times a day (BID) | ORAL | Status: DC
  Administered 2014-07-30 – 2014-08-03 (×9): 250 mg via ORAL
  Filled 2014-07-30 (×10): qty 1

## 2014-07-30 MED ORDER — ALUM & MAG HYDROXIDE-SIMETH 200-200-20 MG/5ML PO SUSP: 30.0000 mL | Freq: Four times a day (QID) | ORAL | Status: DC | PRN

## 2014-07-30 MED ORDER — LIP MEDEX EX OINT
1.0000 "application " | TOPICAL_OINTMENT | Freq: Two times a day (BID) | CUTANEOUS | Status: DC
  Administered 2014-08-01 – 2014-08-03 (×2): 1 via TOPICAL
  Filled 2014-07-30: qty 7

## 2014-07-30 MED ORDER — MENTHOL 3 MG MT LOZG
1.0000 | LOZENGE | OROMUCOSAL | Status: DC | PRN
  Filled 2014-07-30: qty 9

## 2014-07-30 MED ORDER — LACTATED RINGERS IV BOLUS (SEPSIS): 1000.0000 mL | Freq: Three times a day (TID) | INTRAVENOUS | Status: AC | PRN

## 2014-07-30 MED ORDER — MAGIC MOUTHWASH
15.0000 mL | Freq: Four times a day (QID) | ORAL | Status: DC | PRN
  Filled 2014-07-30: qty 15

## 2014-07-30 MED ORDER — PHENOL 1.4 % MT LIQD: 2.0000 | OROMUCOSAL | Status: DC | PRN

## 2014-07-30 MED ORDER — PROMETHAZINE HCL 25 MG/ML IJ SOLN: 6.2500 mg | INTRAMUSCULAR | Status: DC | PRN

## 2014-07-30 NOTE — Progress Notes (Signed)
ANTIBIOTIC CONSULT NOTE - Follow up  Pharmacy Consult for Zosyn Indication: S/p appendectomy for walled off ruptured appendix  Allergies  Allergen Reactions  . Ciprofloxacin     REACTION: rash when had fever    Patient Measurements: Height: 5\' 3"  (160 cm) Weight: 157 lb 14.4 oz (71.623 kg) IBW/kg (Calculated) : 52.4   Vital Signs: Temp: 98.2 F (36.8 C) (07/15 0625) Temp Source: Oral (07/15 0625) BP: 110/67 mmHg (07/15 0625) Pulse Rate: 78 (07/15 0625) Intake/Output from previous day: 07/14 0701 - 07/15 0700 In: 2674.2 [P.O.:720; I.V.:1704.2; IV Piggyback:250] Out: 1200 [Urine:1050; Drains:150] Intake/Output from this shift: Total I/O In: 120 [P.O.:120] Out: 250 [Urine:200; Drains:50]  Labs:  Recent Labs  07/27/14 1630 07/28/14 0405 07/30/14 0455  WBC 13.7* 20.7* 10.3  HGB 11.2* 10.6* 10.0*  PLT 207 207 222  CREATININE 0.78  --  0.75   Estimated Creatinine Clearance: 81.6 mL/min (by C-G formula based on Cr of 0.75).    Microbiology: Recent Results (from the past 720 hour(s))  Surgical pcr screen     Status: Abnormal   Collection Time: 07/27/14  3:04 AM  Result Value Ref Range Status   MRSA, PCR NEGATIVE NEGATIVE Final   Staphylococcus aureus POSITIVE (A) NEGATIVE Final    Comment:        The Xpert SA Assay (FDA approved for NASAL specimens in patients over 64 years of age), is one component of a comprehensive surveillance program.  Test performance has been validated by [facility] for patients greater than or equal to 26 year old. It is not intended to diagnose infection nor to guide or monitor treatment.     Medical History: Past Medical History  Diagnosis Date  . 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  MD changed Unasyn to Zosyn 7/12 with Rx to dose  Goal of Therapy:  Appropriate abx dosing for  indication and renal function. Eradicaton of infection  Today, 07/30/2014: D#3 Zosyn 3.375 grams IV q8h (each dose over 4 hours) Remains afebrile WBC wnl SCr wnl, CrCl well above the 20 mL/min threshold for use of extended-infusion Zosyn regimen No culture data For possible discharge over weekend  Plan:  Continue present Zosyn dosage Pharmacy will sign off for further note writing for now and follow from a distance.  Clayburn Pert, PharmD, BCPS Pager: [phone] 07/30/2014  1:48 PM

## 2014-07-30 NOTE — Progress Notes (Signed)
3 Days Post-Op  Subjective: She looks and feels much better.  Sites look fine, her drainage is serous, but still a little cloudy. She is trying some toast. Some flatus.  Objective: Vital signs in last 24 hours: Temp:  [97.8 F (36.6 C)-98.3 F (36.8 C)] 98.2 F (36.8 C) (07/15 0625) Pulse Rate:  [74-80] 78 (07/15 0625) Resp:  [16] 16 (07/15 0625) BP: (107-115)/(67-68) 110/67 mmHg (07/15 0625) SpO2:  [100 %] 100 % (07/15 0625) Last BM Date: 07/26/14 720 PO 150 from the drain Afebrile, VSS WBC back to normal  Intake/Output from previous day: 07/14 0701 - 07/15 0700 In: 2674.2 [P.O.:720; I.V.:1704.2; IV Piggyback:250] Out: 1200 [Urine:1050; Drains:150] Intake/Output this shift:    General appearance: alert, cooperative and no distress GI: soft, sore, but not distended, sites look good, minimal flatus, no BM fluid is improving.  Lab Results:   Recent Labs  07/28/14 0405 07/30/14 0455  WBC 20.7* 10.3  HGB 10.6* 10.0*  HCT 31.3* 29.8*  PLT 207 222    BMET  Recent Labs  07/27/14 1630 07/30/14 0455  NA  --  134*  K  --  3.8  CL  --  104  CO2  --  26  GLUCOSE  --  96  BUN  --  14  CREATININE 0.78 0.75  CALCIUM  --  7.5*   PT/INR No results for input(s): LABPROT, INR in the last 72 hours.   Recent Labs Lab 07/26/14 1932  AST 14*  ALT 14  ALKPHOS 48  BILITOT 1.2  PROT 7.9  ALBUMIN 4.1     Lipase     Component Value Date/Time   LIPASE 22 07/26/2014 1932     Studies/Results: No results found.  Medications: . heparin subcutaneous  5,000 Units Subcutaneous 3 times per day  . piperacillin-tazobactam (ZOSYN)  IV  3.375 g Intravenous Q8H    Assessment/Plan Acute appendicitis with rupture S/p Laparoscopic appendectomy for walled off ruptured appendix,(right ovary and fallopian tube) 07/27/14, Dr. Hassell Done POD # 1 Hx of C section and Hernia repair Antibiotics: Unasyn started early AM 7/12, switched to Zosyn PM 07/27/14, after surgery Day 3.5  Zosyn DVT: SCD/heparin   Plan:  Add probiotic, continue to mobilize, continue IV antibiotic and hopefully home this weekend with antibiotics.  Decrease IV fluids.  PO pain med    LOS: 3 days    Ellen Hayden 07/30/2014

## 2014-07-31 NOTE — Progress Notes (Signed)
4 Days Post-Op  Subjective: Still has pain but seems to have tolerated surgery well  Objective: Vital signs in last 24 hours: Temp:  [98.1 F (36.7 C)-98.8 F (37.1 C)] 98.8 F (37.1 C) (07/15 2232) Pulse Rate:  [70] 70 (07/15 2232) Resp:  [16-18] 18 (07/15 2232) BP: (109-113)/(53-76) 109/53 mmHg (07/15 2232) SpO2:  [100 %] 100 % (07/15 2232) Last BM Date: 07/26/14  Intake/Output from previous day: 07/15 0701 - 07/16 0700 In: 1072.9 [P.O.:240; I.V.:732.9; IV Piggyback:100] Out: 2450 [Urine:2300; Drains:150] Intake/Output this shift:    Resp: clear to auscultation bilaterally Cardio: regular rate and rhythm GI: soft, minimal tenderness. good bs. drain output serous  Lab Results:   Recent Labs  07/30/14 0455  WBC 10.3  HGB 10.0*  HCT 29.8*  PLT 222   BMET  Recent Labs  07/30/14 0455  NA 134*  K 3.8  CL 104  CO2 26  GLUCOSE 96  BUN 14  CREATININE 0.75  CALCIUM 7.5*   PT/INR No results for input(s): LABPROT, INR in the last 72 hours. ABG No results for input(s): PHART, HCO3 in the last 72 hours.  Invalid input(s): PCO2, PO2  Studies/Results: No results found.  Anti-infectives: Anti-infectives    Start     Dose/Rate Route Frequency Ordered Stop   07/27/14 1800  Ampicillin-Sulbactam (UNASYN) 3 g in sodium chloride 0.9 % 100 mL IVPB  Status:  Discontinued     3 g 100 mL/hr over 60 Minutes Intravenous Every 6 hours 07/27/14 1550 07/27/14 1645   07/27/14 1800  piperacillin-tazobactam (ZOSYN) IVPB 3.375 g     3.375 g 12.5 mL/hr over 240 Minutes Intravenous Every 8 hours 07/27/14 1653     07/27/14 0115  Ampicillin-Sulbactam (UNASYN) 3 g in sodium chloride 0.9 % 100 mL IVPB  Status:  Discontinued     3 g 100 mL/hr over 60 Minutes Intravenous Every 6 hours 07/27/14 0101 07/27/14 1550      Assessment/Plan: s/p Procedure(s): APPENDECTOMY LAPAROSCOPIC (N/A) Advance diet  Continue Zosyn Continue drain for now  LOS: 4 days    TOTH III,PAUL  S 07/31/2014

## 2014-08-01 MED ORDER — AMOXICILLIN-POT CLAVULANATE 875-125 MG PO TABS
1.0000 | ORAL_TABLET | Freq: Two times a day (BID) | ORAL | Status: DC
  Administered 2014-08-01 – 2014-08-03 (×5): 1 via ORAL
  Filled 2014-08-01 (×6): qty 1

## 2014-08-01 NOTE — Progress Notes (Signed)
5 Days Post-Op  Subjective: Feeling better. Still having some occasional nausea  Objective: Vital signs in last 24 hours: Temp:  [97.6 F (36.4 C)-98.5 F (36.9 C)] 98.5 F (36.9 C) (07/17 0630) Pulse Rate:  [64-67] 65 (07/17 0630) Resp:  [14-18] 14 (07/17 0630) BP: (100-108)/(64-69) 104/64 mmHg (07/17 0630) SpO2:  [99 %-100 %] 100 % (07/17 0630) Last BM Date: 07/26/14  Intake/Output from previous day: 07/16 0701 - 07/17 0700 In: 1050 [P.O.:600; I.V.:400; IV Piggyback:50] Out: 1180 [Urine:1075; Drains:105] Intake/Output this shift:    Resp: clear to auscultation bilaterally Cardio: regular rate and rhythm GI: soft, tender at drain site only. good bs. incisions look good  Lab Results:   Recent Labs  07/30/14 0455  WBC 10.3  HGB 10.0*  HCT 29.8*  PLT 222   BMET  Recent Labs  07/30/14 0455  NA 134*  K 3.8  CL 104  CO2 26  GLUCOSE 96  BUN 14  CREATININE 0.75  CALCIUM 7.5*   PT/INR No results for input(s): LABPROT, INR in the last 72 hours. ABG No results for input(s): PHART, HCO3 in the last 72 hours.  Invalid input(s): PCO2, PO2  Studies/Results: No results found.  Anti-infectives: Anti-infectives    Start     Dose/Rate Route Frequency Ordered Stop   08/01/14 1000  amoxicillin-clavulanate (AUGMENTIN) 875-125 MG per tablet 1 tablet     1 tablet Oral Every 12 hours 08/01/14 0808     07/27/14 1800  Ampicillin-Sulbactam (UNASYN) 3 g in sodium chloride 0.9 % 100 mL IVPB  Status:  Discontinued     3 g 100 mL/hr over 60 Minutes Intravenous Every 6 hours 07/27/14 1550 07/27/14 1645   07/27/14 1800  piperacillin-tazobactam (ZOSYN) IVPB 3.375 g  Status:  Discontinued     3.375 g 12.5 mL/hr over 240 Minutes Intravenous Every 8 hours 07/27/14 1653 08/01/14 0808   07/27/14 0115  Ampicillin-Sulbactam (UNASYN) 3 g in sodium chloride 0.9 % 100 mL IVPB  Status:  Discontinued     3 g 100 mL/hr over 60 Minutes Intravenous Every 6 hours 07/27/14 0101 07/27/14 1550       Assessment/Plan: s/p Procedure(s): APPENDECTOMY LAPAROSCOPIC (N/A) Advance diet  Start oral abx today If she tolerates then she may be ready for discharge tomorrow. Will pull drain prior to d/c  LOS: 5 days    [name],Ellen Fleet S 08/01/2014

## 2014-08-02 LAB — COMPREHENSIVE METABOLIC PANEL
ALK PHOS: 96 U/L (ref 38–126)
ALT: 22 U/L (ref 14–54)
AST: 19 U/L (ref 15–41)
Albumin: 2.6 g/dL — ABNORMAL LOW (ref 3.5–5.0)
Anion gap: 6 (ref 5–15)
BUN: 9 mg/dL (ref 6–20)
CALCIUM: 7.9 mg/dL — AB (ref 8.9–10.3)
CO2: 26 mmol/L (ref 22–32)
Chloride: 104 mmol/L (ref 101–111)
Creatinine, Ser: 0.75 mg/dL (ref 0.44–1.00)
GLUCOSE: 108 mg/dL — AB (ref 65–99)
Potassium: 4.1 mmol/L (ref 3.5–5.1)
SODIUM: 136 mmol/L (ref 135–145)
Total Bilirubin: 0.4 mg/dL (ref 0.3–1.2)
Total Protein: 6.1 g/dL — ABNORMAL LOW (ref 6.5–8.1)

## 2014-08-02 LAB — CBC
HCT: 31.6 % — ABNORMAL LOW (ref 36.0–46.0)
Hemoglobin: 10.7 g/dL — ABNORMAL LOW (ref 12.0–15.0)
MCH: 30.4 pg (ref 26.0–34.0)
MCHC: 33.9 g/dL (ref 30.0–36.0)
MCV: 89.8 fL (ref 78.0–100.0)
PLATELETS: 349 10*3/uL (ref 150–400)
RBC: 3.52 MIL/uL — ABNORMAL LOW (ref 3.87–5.11)
RDW: 14.1 % (ref 11.5–15.5)
WBC: 8.3 10*3/uL (ref 4.0–10.5)

## 2014-08-02 MED ORDER — BISACODYL 10 MG RE SUPP
10.0000 mg | Freq: Once | RECTAL | Status: AC
  Administered 2014-08-02: 10 mg via RECTAL
  Filled 2014-08-02: qty 1

## 2014-08-02 MED ORDER — OXYCODONE-ACETAMINOPHEN 5-325 MG PO TABS
1.0000 | ORAL_TABLET | ORAL | Status: DC | PRN
  Administered 2014-08-02 – 2014-08-03 (×3): 1 via ORAL
  Filled 2014-08-02 (×3): qty 1

## 2014-08-02 NOTE — Progress Notes (Signed)
6 Days Post-Op  Subjective: She isn't eating much, still having issues with nausea, and taking Zofran 3-4 times per day.  No BM and some history of constipation before admit.    Objective: Vital signs in last 24 hours: Temp:  [97.9 F (36.6 C)-98.8 F (37.1 C)] 98.8 F (37.1 C) (07/18 0458) Pulse Rate:  [65-70] 65 (07/18 0458) Resp:  [16-18] 16 (07/18 0458) BP: (103-108)/(65-66) 107/65 mmHg (07/18 0458) SpO2:  [99 %-100 %] 100 % (07/18 0458) Last BM Date:  (pta) 240 PO recorded Afebrile, VSS No labs Intake/Output from previous day: 07/17 0701 - 07/18 0700 In: 1640 [P.O.:240; I.V.:1400] Out: 3299 [Urine:2400; Drains:155] Intake/Output this shift:    General appearance: alert, cooperative and no distress Resp: clear to auscultation bilaterally GI: soft, non-tender; bowel sounds normal; no masses,  no organomegaly and drainage is clear.  Lab Results:  No results for input(s): WBC, HGB, HCT, PLT in the last 72 hours.  BMET No results for input(s): NA, K, CL, CO2, GLUCOSE, BUN, CREATININE, CALCIUM in the last 72 hours. PT/INR No results for input(s): LABPROT, INR in the last 72 hours.   Recent Labs Lab 07/26/14 1932  AST 14*  ALT 14  ALKPHOS 48  BILITOT 1.2  PROT 7.9  ALBUMIN 4.1     Lipase     Component Value Date/Time   LIPASE 22 07/26/2014 1932     Studies/Results: No results found.  Medications: . amoxicillin-clavulanate  1 tablet Oral Q12H  . heparin subcutaneous  5,000 Units Subcutaneous 3 times per day  . lip balm  1 application Topical BID  . saccharomyces boulardii  250 mg Oral BID    Assessment/Plan Acute appendicitis with rupture S/p Laparoscopic appendectomy for walled off ruptured appendix,(right ovary and fallopian tube) 07/27/14, Dr. Hassell Done POD # 1 Hx of C section and Hernia repair Antibiotics: Unasyn started early AM 7/12, switched to Zosyn PM 07/27/14, after surgery Day 5.5 Zosyn, Day 2 Augmentin  DVT:  Heparin/SCD  Plan:  Recheck  labs, switch pain medicine around, and see if that helps, dulcolax suppository and see if that helps.  Decide on D/c later today, Saline lock IV till discharge.  LOS: 6 days    Ellen Hayden 08/02/2014

## 2014-08-03 ENCOUNTER — Encounter: Payer: Self-pay | Admitting: General Surgery

## 2014-08-03 MED ORDER — OXYCODONE-ACETAMINOPHEN 5-325 MG PO TABS: 1.0000 | ORAL_TABLET | ORAL | Status: DC | PRN

## 2014-08-03 MED ORDER — ACETAMINOPHEN 325 MG PO TABS: 650.0000 mg | ORAL_TABLET | Freq: Four times a day (QID) | ORAL | Status: AC | PRN

## 2014-08-03 MED ORDER — AMOXICILLIN-POT CLAVULANATE 875-125 MG PO TABS: 1.0000 | ORAL_TABLET | Freq: Two times a day (BID) | ORAL | Status: DC

## 2014-08-03 MED ORDER — SACCHAROMYCES BOULARDII 250 MG PO CAPS: ORAL_CAPSULE | ORAL | Status: DC

## 2014-08-03 MED ORDER — IBUPROFEN 200 MG PO TABS: ORAL_TABLET | ORAL | Status: DC

## 2014-08-03 NOTE — Care Management Note (Signed)
Case Management Note  Patient Details  Name: Ellen Hayden MRN: [id number] Date of Birth: 01/19/1965  Subjective/Objective:                 49 yo admitted with Ruptured appendicitis   Action/Plan: From home with spouse  Expected Discharge Date:                  Expected Discharge Plan:  Home/Self Care  In-House Referral:     Discharge planning Services  CM Consult  Post Acute Care Choice:    Choice offered to:     DME Arranged:    DME Agency:     HH Arranged:    [place] Agency:     Status of Service:  Completed, signed off  Medicare Important Message Given:    Date Medicare IM Given:    Medicare IM give by:    Date Additional Medicare IM Given:    Additional Medicare Important Message give by:     If discussed at [place] of Stay Meetings, dates discussed:    Additional Comments: Chart reviewed and no DC needs noted. Lynnell Catalan, RN 08/03/2014, 12:01 PM

## 2014-08-03 NOTE — Progress Notes (Signed)
Discharge instructions given along with prescriptions.  Questions answered 

## 2014-08-03 NOTE — Progress Notes (Signed)
7 Days Post-Op  Subjective: Drainage is clear, I think her nausea is better.  Had a salad last pM and did well with it .    Objective: Vital signs in last 24 hours: Temp:  [98.5 F (36.9 C)-99.2 F (37.3 C)] 98.9 F (37.2 C) (07/19 0550) Pulse Rate:  [63-72] 72 (07/19 0550) Resp:  [18-20] 20 (07/19 0550) BP: (116-136)/(64-70) 130/64 mmHg (07/19 0550) SpO2:  [100 %] 100 % (07/19 0550) Last BM Date: 08/02/14 240 PO recorded 1 BM 65 from the drain. Afebrile, VSS Labs yesterday look good, WBC 8.3  Intake/Output from previous day: 07/18 0701 - 07/19 0700 In: 240 [P.O.:240] Out: 565 [Urine:500; Drains:65] Intake/Output this shift:    General appearance: alert, cooperative and no distress Resp: clear to auscultation bilaterally GI: soft, still a bit sore.  Drain clear and removed.  + BS, and +BM with suppository.  Lab Results:   Recent Labs  08/02/14 0933  WBC 8.3  HGB 10.7*  HCT 31.6*  PLT 349    BMET  Recent Labs  08/02/14 0933  NA 136  K 4.1  CL 104  CO2 26  GLUCOSE 108*  BUN 9  CREATININE 0.75  CALCIUM 7.9*   PT/INR No results for input(s): LABPROT, INR in the last 72 hours.   Recent Labs Lab 08/02/14 0933  AST 19  ALT 22  ALKPHOS 96  BILITOT 0.4  PROT 6.1*  ALBUMIN 2.6*     Lipase     Component Value Date/Time   LIPASE 22 07/26/2014 1932     Studies/Results: No results found.  Medications: . amoxicillin-clavulanate  1 tablet Oral Q12H  . heparin subcutaneous  5,000 Units Subcutaneous 3 times per day  . lip balm  1 application Topical BID  . saccharomyces boulardii  250 mg Oral BID    Assessment/Plan Acute appendicitis with rupture S/p Laparoscopic appendectomy for walled off ruptured appendix,(right ovary and fallopian tube) 07/27/14, Dr. Hassell Done POD # 1 Hx of C section and Hernia repair Antibiotics: Unasyn started early AM 7/12, switched to Zosyn PM 07/27/14, after surgery Day 5.5 Zosyn, Day 2 Augmentin  DVT: Heparin/SCD    Plan:  Home today, 3 more days of antibiotics 10 total.  Continue probiotic.  Drain out, patient instructed.    LOS: 7 days    Mattye Verdone 08/03/2014

## 2014-08-03 NOTE — Discharge Instructions (Signed)
Laparoscopic Appendectomy Care After Refer to this sheet in the next few weeks. These instructions provide you with information on caring for yourself after your procedure. Your caregiver may also give you more specific instructions. Your treatment has been planned according to current medical practices, but problems sometimes occur. Call your caregiver if you have any problems or questions after your procedure. HOME CARE INSTRUCTIONS  Do not drive while taking narcotic pain medicines.  Use stool softener if you become constipated from your pain medicines.  Change your bandages (dressings) as directed.  Keep your wounds clean and dry. You may wash the wounds gently with soap and water. Gently pat the wounds dry with a clean towel.  Do not take baths, swim, or use hot tubs for 10 days, or as instructed by your caregiver.  Only take over-the-counter or prescription medicines for pain, discomfort, or fever as directed by your caregiver.  You may continue your normal diet as directed.  Do not lift more than 10 pounds (4.5 kg) or play contact sports for 3 weeks, or as directed.  Slowly increase your activity after surgery.  Take deep breaths to avoid getting a lung infection (pneumonia). SEEK MEDICAL CARE IF:  You have redness, swelling, or increasing pain in your wounds.  You have pus coming from your wounds.  You have drainage from a wound that lasts longer than 1 day.  You notice a bad smell coming from the wounds or dressing.  Your wound edges break open after stitches (sutures) have been removed.  You notice increasing pain in the shoulders (shoulder strap areas) or near your shoulder blades.  You develop dizzy episodes or fainting while standing.  You develop shortness of breath.  You develop persistent nausea or vomiting.  You cannot control your bowel functions or lose your appetite.  You develop diarrhea. SEEK IMMEDIATE MEDICAL CARE IF:   You have a fever.  You  develop a rash.  You have difficulty breathing or sharp pains in your chest.  You develop any reaction or side effects to medicines given. MAKE SURE YOU:  Understand these instructions.  Will watch your condition.  Will get help right away if you are not doing well or get worse. Document Released: 01/01/2005 Document Revised: 03/26/2011 Document Reviewed: 07/11/2010 [facility] Patient Information 2015 [place], [place]. This information is not intended to replace advice given to you by your health care provider. Make sure you discuss any questions you have with your health care provider.  CCS ______[facility] LAPAROSCOPIC SURGERY: POST OP INSTRUCTIONS Always review your discharge instruction sheet given to you by the facility where your surgery was performed. IF YOU HAVE DISABILITY OR FAMILY LEAVE FORMS, YOU MUST BRING THEM TO THE OFFICE FOR PROCESSING.   DO NOT GIVE THEM TO YOUR DOCTOR.  1. A prescription for pain medication may be given to you upon discharge.  Take your pain medication as prescribed, if needed.  If narcotic pain medicine is not needed, then you may take acetaminophen (Tylenol) or ibuprofen (Advil) as needed. 2. Take your usually prescribed medications unless otherwise directed. 3. If you need a refill on your pain medication, please contact your pharmacy.  They will contact our office to request authorization. Prescriptions will not be filled after 5pm or on week-ends. 4. You should follow a light diet the first few days after arrival home, such as soup and crackers, etc.  Be sure to include lots of fluids daily. 5. Most patients will experience some swelling and  bruising in the area of the incisions.  Ice packs will help.  Swelling and bruising can take several days to resolve.  6. It is common to experience some constipation if taking pain medication after surgery.  Increasing fluid intake and taking a stool softener (such as Colace) will usually help or  prevent this problem from occurring.  A mild laxative (Milk of Magnesia or Miralax) should be taken according to package instructions if there are no bowel movements after 48 hours. 7. Unless discharge instructions indicate otherwise, you may remove your bandages 24-48 hours after surgery, and you may shower at that time.  You may have steri-strips (small skin tapes) in place directly over the incision.  These strips should be left on the skin for 7-10 days.  If your surgeon used skin glue on the incision, you may shower in 24 hours.  The glue will flake off over the next 2-3 weeks.  Any sutures or staples will be removed at the office during your follow-up visit. 8. ACTIVITIES:  You may resume regular (light) daily activities beginning the next day--such as daily self-care, walking, climbing stairs--gradually increasing activities as tolerated.  You may have sexual intercourse when it is comfortable.  Refrain from any heavy lifting or straining until approved by your doctor. a. You may drive when you are no longer taking prescription pain medication, you can comfortably wear a seatbelt, and you can safely maneuver your car and apply brakes. b. RETURN TO WORK:  __________________________________________________________ 9. You should see your doctor in the office for a follow-up appointment approximately 2-3 weeks after your surgery.  Make sure that you call for this appointment within a day or two after you arrive home to insure a convenient appointment time. 10. OTHER INSTRUCTIONS: __________________________________________________________________________________________________________________________ __________________________________________________________________________________________________________________________ WHEN TO CALL YOUR DOCTOR: 1. Fever over 101.0 2. Inability to urinate 3. Continued bleeding from incision. 4. Increased pain, redness, or drainage from the incision. 5. Increasing  abdominal pain  The clinic staff is available to answer your questions during regular business hours.  Please dont hesitate to call and ask to speak to one of the nurses for clinical concerns.  If you have a medical emergency, go to the nearest emergency room or call 911.  A surgeon from [facility] is always on call at the hospital. [address] [place], [place], [place]  [address] ? [address], [place], [place]   [phone] ? [phone] ? FAX [phone] Web site: [email or web address] Constipation Constipation is when a person has fewer than three bowel movements a week, has difficulty having a bowel movement, or has stools that are dry, hard, or larger than normal. As people grow older, constipation is more common. If you try to fix constipation with medicines that make you have a bowel movement (laxatives), the problem may get worse. Long-term laxative use may cause the muscles of the colon to become weak. A low-fiber diet, not taking in enough fluids, and taking certain medicines may make constipation worse.  CAUSES   Certain medicines, such as antidepressants, pain medicine, iron supplements, antacids, and water pills.   Certain diseases, such as diabetes, irritable bowel syndrome (IBS), thyroid disease, or depression.   Not drinking enough water.   Not eating enough fiber-rich foods.   Stress or travel.   Lack of physical activity or exercise.   Ignoring the urge to have a bowel movement.   Using laxatives too much.  SIGNS AND SYMPTOMS   Having fewer than  three bowel movements a week.   Straining to have a bowel movement.   Having stools that are hard, dry, or larger than normal.   Feeling full or bloated.   Pain in the lower abdomen.   Not feeling relief after having a bowel movement.  DIAGNOSIS  Your health care provider will take a medical history and perform a physical exam. Further testing may be done  for severe constipation. Some tests may include:  A barium enema X-ray to examine your rectum, colon, and, sometimes, your small intestine.   A sigmoidoscopy to examine your lower colon.   A colonoscopy to examine your entire colon. TREATMENT  Treatment will depend on the severity of your constipation and what is causing it. Some dietary treatments include drinking more fluids and eating more fiber-rich foods. Lifestyle treatments may include regular exercise. If these diet and lifestyle recommendations do not help, your health care provider may recommend taking over-the-counter laxative medicines to help you have bowel movements. Prescription medicines may be prescribed if over-the-counter medicines do not work.  HOME CARE INSTRUCTIONS   Eat foods that have a lot of fiber, such as fruits, vegetables, whole grains, and beans.  Limit foods high in fat and processed sugars, such as french fries, hamburgers, cookies, candies, and soda.   A fiber supplement may be added to your diet if you cannot get enough fiber from foods.   Drink enough fluids to keep your urine clear or pale yellow.   Exercise regularly or as directed by your health care provider.   Go to the restroom when you have the urge to go. Do not hold it.   Only take over-the-counter or prescription medicines as directed by your health care provider. Do not take other medicines for constipation without talking to your health care provider first.  [place] IF:   You have bright red blood in your stool.   Your constipation lasts for more than 4 days or gets worse.   You have abdominal or rectal pain.   You have thin, pencil-like stools.   You have unexplained weight loss. MAKE SURE YOU:   Understand these instructions.  Will watch your condition.  Will get help right away if you are not doing well or get worse. Document Released: 09/30/2003 Document Revised: 01/06/2013 Document Reviewed:  10/13/2012 [facility] Patient Information 2015 [place], [place]. This information is not intended to replace advice given to you by your health care provider. Make sure you discuss any questions you have with your health care provider.

## 2014-08-25 ENCOUNTER — Ambulatory Visit (INDEPENDENT_AMBULATORY_CARE_PROVIDER_SITE_OTHER): Payer: Managed Care, Other (non HMO) | Admitting: Gynecology

## 2014-08-25 ENCOUNTER — Encounter: Payer: Self-pay | Admitting: Gynecology

## 2014-08-25 VITALS — BP 120/70

## 2014-08-25 DIAGNOSIS — R102 Pelvic and perineal pain: Secondary | ICD-10-CM | POA: Diagnosis not present

## 2014-08-25 NOTE — Patient Instructions (Signed)
Call if your pain continues. 

## 2014-08-25 NOTE — Progress Notes (Signed)
Ellen Hayden January 15, 1966 [id number]        49 y.o.  [name] presents with 1-2 day history of pelvic pressure and discomfort. Felt like she was constipated and took milk of magnesia. Has had some loose stools today but still feels a lot of pressure symptoms. No nausea vomiting fever or chills. No frequency dysuria or urgency. On oral contraceptives for menstrual suppression. Recent laparoscopic appendectomy in July. Reportedly uncomplicated.  Past medical history,surgical history, problem list, medications, allergies, family history and social history were all reviewed and documented in the EPIC chart.  Directed ROS with pertinent positives and negatives documented in the history of present illness/assessment and plan.  Exam: Kim assistant Filed Vitals:   08/25/14 1024  BP: 120/70   General appearance:  Normal Spine straight without CVA tenderness Abdomen soft with reported suprapubic discomfort. No masses guarding rebound with active bowel sounds throughout. Pelvic external BUS vagina normal. Cervix normal. Uterus normal size midline mobile nontender. Adnexa without masses or tenderness. Rectal exam is normal.  Assessment/Plan:  49 y.o. [id number] pelvic bloating/pressure symptoms over the last 2 days. Having some constipation now with loose stools secondary to laxative. Exam is unremarkable. Doubt related to the appendectomy. Urinalysis unremarkable excepting few bacteria. Will follow up with culture. Recommended pushing fluids and observing over the next 24-48 hours. Differential to include possible ruptured ovarian cyst discussed. Given the negative exam will watch for now and if her discomfort continues she knows to call and we'll start with ultrasound. If her pain totally disappears then will follow expectantly.    Anastasio Auerbach MD, 10:50 AM 08/25/2014

## 2014-08-26 LAB — URINALYSIS W MICROSCOPIC + REFLEX CULTURE
BILIRUBIN URINE: NEGATIVE
GLUCOSE, UA: NEGATIVE
Hgb urine dipstick: NEGATIVE
Ketones, ur: NEGATIVE
Leukocytes, UA: NEGATIVE
NITRITE: NEGATIVE
RBC / HPF: NONE SEEN RBC/HPF (ref <=2)
SPECIFIC GRAVITY, URINE: 1.015 (ref 1.001–1.035)
WBC, UA: NONE SEEN WBC/HPF (ref <=5)
Yeast: NONE SEEN [HPF]
pH: 8.5 — ABNORMAL HIGH (ref 5.0–8.0)

## 2014-08-26 LAB — URINE CULTURE
Colony Count: NO GROWTH
Organism ID, Bacteria: NO GROWTH

## 2014-09-27 ENCOUNTER — Other Ambulatory Visit: Payer: Self-pay | Admitting: Gynecology

## 2014-09-27 ENCOUNTER — Other Ambulatory Visit: Payer: Self-pay

## 2014-09-27 DIAGNOSIS — N63 Unspecified lump in unspecified breast: Secondary | ICD-10-CM

## 2014-10-22 ENCOUNTER — Other Ambulatory Visit: Payer: Self-pay | Admitting: Gynecology

## 2014-10-22 ENCOUNTER — Ambulatory Visit
Admission: RE | Admit: 2014-10-22 | Discharge: 2014-10-22 | Disposition: A | Discharge status: Home / Self Care | Payer: Managed Care, Other (non HMO) | Source: Ambulatory Visit | Attending: Gynecology | Admitting: Gynecology

## 2014-10-22 DIAGNOSIS — Z1231 Encounter for screening mammogram for malignant neoplasm of breast: Secondary | ICD-10-CM

## 2014-10-22 DIAGNOSIS — N63 Unspecified lump in unspecified breast: Secondary | ICD-10-CM

## 2015-04-25 ENCOUNTER — Ambulatory Visit
Admission: RE | Admit: 2015-04-25 | Discharge: 2015-04-25 | Disposition: A | Discharge status: Home / Self Care | Payer: Managed Care, Other (non HMO) | Source: Ambulatory Visit | Attending: Gynecology | Admitting: Gynecology

## 2015-04-25 DIAGNOSIS — Z1231 Encounter for screening mammogram for malignant neoplasm of breast: Secondary | ICD-10-CM

## 2015-04-29 ENCOUNTER — Ambulatory Visit: Payer: 59

## 2015-07-08 ENCOUNTER — Encounter: Payer: Self-pay | Admitting: Gynecology

## 2015-07-08 ENCOUNTER — Ambulatory Visit (INDEPENDENT_AMBULATORY_CARE_PROVIDER_SITE_OTHER): Payer: Managed Care, Other (non HMO) | Admitting: Gynecology

## 2015-07-08 VITALS — BP 118/74 | Ht 63.5 in | Wt 159.0 lb

## 2015-07-08 DIAGNOSIS — Z01419 Encounter for gynecological examination (general) (routine) without abnormal findings: Secondary | ICD-10-CM | POA: Diagnosis not present

## 2015-07-08 DIAGNOSIS — R5383 Other fatigue: Secondary | ICD-10-CM

## 2015-07-08 DIAGNOSIS — Z13220 Encounter for screening for lipoid disorders: Secondary | ICD-10-CM

## 2015-07-08 LAB — CBC WITH DIFFERENTIAL/PLATELET
Basophils Absolute: 0 cells/uL (ref 0–200)
Basophils Relative: 0 %
EOS ABS: 96 {cells}/uL (ref 15–500)
EOS PCT: 1 %
HCT: 38.8 % (ref 35.0–45.0)
Hemoglobin: 12.8 g/dL (ref 11.7–15.5)
LYMPHS PCT: 37 %
Lymphs Abs: 3552 cells/uL (ref 850–3900)
MCH: 30.5 pg (ref 27.0–33.0)
MCHC: 33 g/dL (ref 32.0–36.0)
MCV: 92.6 fL (ref 80.0–100.0)
MPV: 9.4 fL (ref 7.5–12.5)
Monocytes Absolute: 672 cells/uL (ref 200–950)
Monocytes Relative: 7 %
Neutro Abs: 5280 cells/uL (ref 1500–7800)
Neutrophils Relative %: 55 %
PLATELETS: 275 10*3/uL (ref 140–400)
RBC: 4.19 MIL/uL (ref 3.80–5.10)
RDW: 13.5 % (ref 11.0–15.0)
WBC: 9.6 10*3/uL (ref 3.8–10.8)

## 2015-07-08 LAB — TSH: TSH: 1.54 mIU/L

## 2015-07-08 MED ORDER — NORETHINDRONE ACET-ETHINYL EST 1-20 MG-MCG PO TABS: 1.0000 | ORAL_TABLET | Freq: Every day | ORAL | Status: DC

## 2015-07-08 NOTE — Patient Instructions (Signed)

## 2015-07-08 NOTE — Progress Notes (Signed)
    Ellen Hayden 22-Jul-1965 [id number]        50 y.o.  [id number]  for annual exam.  Doing well.  Past medical history,surgical history, problem list, medications, allergies, family history and social history were all reviewed and documented as reviewed in the EPIC chart.  ROS:  Performed with pertinent positives and negatives included in the history, assessment and plan.   Additional significant findings :  Fatigue as discussed below   Exam: Ellen Hayden assistant Filed Vitals:   07/08/15 1527  BP: 118/74  Height: 5' 3.5" (1.613 m)  Weight: 159 lb (72.122 kg)   General appearance:  Normal affect, orientation and appearance. Skin: Grossly normal HEENT: Without gross lesions.  No cervical or supraclavicular adenopathy. Thyroid normal.  Lungs:  Clear without wheezing, rales or rhonchi Cardiac: RR, without RMG Abdominal:  Soft, nontender, without masses, guarding, rebound, organomegaly or hernia Breasts:  Examined lying and sitting without masses, retractions, discharge or axillary adenopathy. Pelvic:  Ext/BUS/vagina normal  Cervix normal  Uterus anteverted, normal size, shape and contour, midline and mobile nontender   Adnexa without masses or tenderness    Anus and perineum normal   Rectovaginal normal sphincter tone without palpated masses or tenderness.    Assessment/Plan:  50 y.o. [id number] female for annual exam without regular menses, continuous oral contraceptives.   1. Patient continues on Loestrin 1/20 equivalent continuous oral contraceptives for menstrual suppression.  Does have a withdrawal bleed every 4-5 months. Wants to continue. The issues of thrombosis have been discussed with her multiple times and she is comfortable continuing. No history of significant medical issues and never smoked. Refill 1 year provided. 2. Pap smear/HPV 2014. No Pap smear today. 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 Plan repeat Pap smear approaching  5 year interval per current screening guidelines. 3. Mammography 04/2015. Continue with annual mammography when due. SBE monthly reviewed. 4. Health maintenance. Notes some intermittent fatigue during the day particularly. No, significant acute weight gain or weight loss hair or skin changes. Check TSH, CBC, CMP.  Also check lipid profile, urinalysis. Follow up in one year, sooner as needed.   Ellen Auerbach MD, 3:54 PM 07/08/2015

## 2015-07-09 LAB — COMPREHENSIVE METABOLIC PANEL
ALT: 12 U/L (ref 6–29)
AST: 11 U/L (ref 10–35)
Albumin: 4 g/dL (ref 3.6–5.1)
Alkaline Phosphatase: 48 U/L (ref 33–115)
BUN: 21 mg/dL (ref 7–25)
CHLORIDE: 102 mmol/L (ref 98–110)
CO2: 24 mmol/L (ref 20–31)
Calcium: 9 mg/dL (ref 8.6–10.2)
Creat: 0.88 mg/dL (ref 0.50–1.10)
GLUCOSE: 80 mg/dL (ref 65–99)
POTASSIUM: 3.7 mmol/L (ref 3.5–5.3)
SODIUM: 136 mmol/L (ref 135–146)
Total Bilirubin: 0.3 mg/dL (ref 0.2–1.2)
Total Protein: 6.8 g/dL (ref 6.1–8.1)

## 2015-07-09 LAB — URINALYSIS W MICROSCOPIC + REFLEX CULTURE
Bacteria, UA: NONE SEEN [HPF]
Bilirubin Urine: NEGATIVE
CASTS: NONE SEEN [LPF]
Crystals: NONE SEEN [HPF]
Glucose, UA: NEGATIVE
HGB URINE DIPSTICK: NEGATIVE
Ketones, ur: NEGATIVE
Leukocytes, UA: NEGATIVE
NITRITE: NEGATIVE
PH: 7.5 (ref 5.0–8.0)
Protein, ur: NEGATIVE
RBC / HPF: NONE SEEN RBC/HPF (ref <=2)
SPECIFIC GRAVITY, URINE: 1.016 (ref 1.001–1.035)
Squamous Epithelial / LPF: NONE SEEN [HPF] (ref <=5)
WBC, UA: NONE SEEN WBC/HPF (ref <=5)
YEAST: NONE SEEN [HPF]

## 2015-07-09 LAB — LIPID PANEL
CHOL/HDL RATIO: 3.1 ratio (ref <=5.0)
Cholesterol: 160 mg/dL (ref 125–200)
HDL: 52 mg/dL (ref >=46)
LDL Cholesterol: 70 mg/dL (ref <130)
Triglycerides: 190 mg/dL — ABNORMAL HIGH (ref <150)
VLDL: 38 mg/dL — AB (ref <30)

## 2015-09-07 ENCOUNTER — Other Ambulatory Visit: Payer: Self-pay | Admitting: Gynecology

## 2016-03-20 ENCOUNTER — Other Ambulatory Visit: Payer: Self-pay | Admitting: Gynecology

## 2016-03-20 DIAGNOSIS — Z1231 Encounter for screening mammogram for malignant neoplasm of breast: Secondary | ICD-10-CM

## 2016-04-30 ENCOUNTER — Ambulatory Visit
Admission: RE | Admit: 2016-04-30 | Discharge: 2016-04-30 | Disposition: A | Discharge status: Home / Self Care | Payer: Managed Care, Other (non HMO) | Source: Ambulatory Visit | Attending: Gynecology | Admitting: Gynecology

## 2016-04-30 DIAGNOSIS — Z1231 Encounter for screening mammogram for malignant neoplasm of breast: Secondary | ICD-10-CM

## 2016-05-16 ENCOUNTER — Other Ambulatory Visit: Payer: Self-pay | Admitting: Gynecology

## 2016-07-11 ENCOUNTER — Ambulatory Visit (INDEPENDENT_AMBULATORY_CARE_PROVIDER_SITE_OTHER): Payer: 59 | Admitting: Gynecology

## 2016-07-11 ENCOUNTER — Encounter: Payer: Self-pay | Admitting: Gynecology

## 2016-07-11 VITALS — BP 118/74 | Ht 63.5 in | Wt 152.0 lb

## 2016-07-11 DIAGNOSIS — Z1151 Encounter for screening for human papillomavirus (HPV): Secondary | ICD-10-CM

## 2016-07-11 DIAGNOSIS — Z01419 Encounter for gynecological examination (general) (routine) without abnormal findings: Secondary | ICD-10-CM | POA: Diagnosis not present

## 2016-07-11 DIAGNOSIS — R5383 Other fatigue: Secondary | ICD-10-CM

## 2016-07-11 DIAGNOSIS — Z13220 Encounter for screening for lipoid disorders: Secondary | ICD-10-CM | POA: Diagnosis not present

## 2016-07-11 LAB — LIPID PANEL
CHOL/HDL RATIO: 2.6 ratio (ref <5.0)
Cholesterol: 155 mg/dL (ref <200)
HDL: 59 mg/dL (ref >50)
LDL Cholesterol: 78 mg/dL (ref <100)
Triglycerides: 89 mg/dL (ref <150)
VLDL: 18 mg/dL (ref <30)

## 2016-07-11 LAB — COMPREHENSIVE METABOLIC PANEL
ALBUMIN: 4 g/dL (ref 3.6–5.1)
ALT: 13 U/L (ref 6–29)
AST: 14 U/L (ref 10–35)
Alkaline Phosphatase: 42 U/L (ref 33–130)
BILIRUBIN TOTAL: 0.6 mg/dL (ref 0.2–1.2)
BUN: 16 mg/dL (ref 7–25)
CALCIUM: 8.9 mg/dL (ref 8.6–10.4)
CO2: 22 mmol/L (ref 20–31)
Chloride: 102 mmol/L (ref 98–110)
Creat: 0.86 mg/dL (ref 0.50–1.05)
Glucose, Bld: 95 mg/dL (ref 65–99)
POTASSIUM: 4.2 mmol/L (ref 3.5–5.3)
Sodium: 136 mmol/L (ref 135–146)
Total Protein: 6.6 g/dL (ref 6.1–8.1)

## 2016-07-11 LAB — TSH: TSH: 1.23 mIU/L

## 2016-07-11 LAB — CBC WITH DIFFERENTIAL/PLATELET
BASOS ABS: 0 {cells}/uL (ref 0–200)
Basophils Relative: 0 %
EOS ABS: 106 {cells}/uL (ref 15–500)
Eosinophils Relative: 1 %
HEMATOCRIT: 38.2 % (ref 35.0–45.0)
HEMOGLOBIN: 12.9 g/dL (ref 11.7–15.5)
LYMPHS PCT: 22 %
Lymphs Abs: 2332 cells/uL (ref 850–3900)
MCH: 31.1 pg (ref 27.0–33.0)
MCHC: 33.8 g/dL (ref 32.0–36.0)
MCV: 92 fL (ref 80.0–100.0)
MONO ABS: 636 {cells}/uL (ref 200–950)
MPV: 9.6 fL (ref 7.5–12.5)
Monocytes Relative: 6 %
Neutro Abs: 7526 cells/uL (ref 1500–7800)
Neutrophils Relative %: 71 %
Platelets: 267 10*3/uL (ref 140–400)
RBC: 4.15 MIL/uL (ref 3.80–5.10)
RDW: 14.1 % (ref 11.0–15.0)
WBC: 10.6 10*3/uL (ref 3.8–10.8)

## 2016-07-11 MED ORDER — NORETHINDRONE ACET-ETHINYL EST 1-20 MG-MCG PO TABS: 1.0000 | ORAL_TABLET | Freq: Every day | ORAL | 3 refills | Status: DC

## 2016-07-11 NOTE — Patient Instructions (Signed)
Follow up at the end of your pill free week for hormonal blood work.

## 2016-07-11 NOTE — Progress Notes (Signed)
    Ellen Hayden 11-22-1965 [id number]        51 y.o.  [id number] for annual exam.  Also complaining of fatigue. Finding yourself very tired particularly on weekends. No changes in caffeine intake. Exercises on a regular basis. No hair skin or weight changes. No nausea vomiting diarrhea constipation.  Past medical history,surgical history, problem list, medications, allergies, family history and social history were all reviewed and documented as reviewed in the EPIC chart.  ROS:  Performed with pertinent positives and negatives included in the history, assessment and plan.   Additional significant findings :  None   Exam: Caryn Bee assistant Vitals:   07/11/16 0834  BP: 118/74  Weight: 152 lb (68.9 kg)  Height: 5' 3.5" (1.613 m)   Body mass index is 26.5 kg/m.  General appearance:  Normal affect, orientation and appearance. Skin: Grossly normal HEENT: Without gross lesions.  No cervical or supraclavicular adenopathy. Thyroid normal.  Lungs:  Clear without wheezing, rales or rhonchi Cardiac: RR, without RMG Abdominal:  Soft, nontender, without masses, guarding, rebound, organomegaly or hernia Breasts:  Examined lying and sitting without masses, retractions, discharge or axillary adenopathy. Pelvic:  Ext, BUS, Vagina: Normal  Cervix: Normal. Pap smear/HPV  Uterus: Anteverted, normal size, shape and contour, midline and mobile nontender   Adnexa: Without masses or tenderness    Anus and perineum: Normal   Rectovaginal: Normal sphincter tone without palpated masses or tenderness.    Assessment/Plan:  51 y.o. [id number] female for annual exam regular menses, oral contraceptives.   1. Fatigue. Over the last several months patient finds herself tired feeling like she needs to take a nap particular on weekends. No other symptoms such as weight changes hair or skin changes. No nausea vomiting diarrhea constipation. Will check baseline labs to include CBC, CMP, TSH. If persists then  we'll have primary physician evaluation. 2. Extended use Loestrin 1/20 equivalent. Having menses every 3-4 months. Recommended returning at end of pill free week to have [facility] checked. If elevated then discussed stopping oral contraceptives and seeing what her cycles and symptoms will do. If continued bleeding afterwards then restart low-dose oral contraceptives. If significant menopausal symptoms consider HRT. Is using vasectomy for birth control and oral contraceptives for menstrual regulation. I again reviewed the risks of oral contraceptives particularly with advancing age to include thrombosis such as stroke heart attack DVT. She never smoked and is not being followed for any significant medical issues as well as exercises on a regular basis. 3. Pap smear/HPV 2014. Pap smear/HPV done today. 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 4. Mammography 04/2016. Continue with annual mammography when due. SBE monthly reviewed. Breast exam normal today. 5. Health maintenance. CBC, CMP, lipid profile, TSH done today. Follow up for [facility] at the end of her pill free week.  Additional time in excess of her routine gynecologic exam was spent in direct face to face counseling and coordination of care in regards to her fatigue and menstrual regulation.    Anastasio Auerbach MD, 9:01 AM 07/11/2016

## 2016-07-11 NOTE — Addendum Note (Signed)
Addended by: Nelva Nay on: 07/11/2016 09:22 AM   Modules accepted: Orders

## 2016-07-12 LAB — URINALYSIS W MICROSCOPIC + REFLEX CULTURE
BACTERIA UA: NONE SEEN [HPF]
Bilirubin Urine: NEGATIVE
Casts: NONE SEEN [LPF]
Crystals: NONE SEEN [HPF]
GLUCOSE, UA: NEGATIVE
Hgb urine dipstick: NEGATIVE
Ketones, ur: NEGATIVE
LEUKOCYTES UA: NEGATIVE
Nitrite: NEGATIVE
Protein, ur: NEGATIVE
Specific Gravity, Urine: 1.021 (ref 1.001–1.035)
Squamous Epithelial / LPF: NONE SEEN [HPF] (ref <=5)
WBC, UA: NONE SEEN WBC/HPF (ref <=5)
Yeast: NONE SEEN [HPF]
pH: 7.5 (ref 5.0–8.0)

## 2016-07-13 LAB — URINE CULTURE: Organism ID, Bacteria: NO GROWTH

## 2016-07-16 LAB — PAP IG AND HPV HIGH-RISK: HPV DNA HIGH RISK: NOT DETECTED

## 2016-07-19 ENCOUNTER — Encounter: Payer: Self-pay | Admitting: Gynecology

## 2016-11-23 ENCOUNTER — Other Ambulatory Visit: Payer: 59

## 2016-11-23 DIAGNOSIS — R5383 Other fatigue: Secondary | ICD-10-CM

## 2016-11-24 LAB — FOLLICLE STIMULATING HORMONE: FSH: 10.1 m[IU]/mL

## 2016-11-26 ENCOUNTER — Encounter: Payer: Self-pay | Admitting: *Deleted

## 2016-12-05 ENCOUNTER — Encounter: Payer: Self-pay | Admitting: Family Medicine

## 2016-12-05 ENCOUNTER — Ambulatory Visit (INDEPENDENT_AMBULATORY_CARE_PROVIDER_SITE_OTHER)
Admission: RE | Admit: 2016-12-05 | Discharge: 2016-12-05 | Disposition: A | Discharge status: Home / Self Care | Payer: 59 | Source: Ambulatory Visit | Attending: Family Medicine | Admitting: Family Medicine

## 2016-12-05 ENCOUNTER — Ambulatory Visit (INDEPENDENT_AMBULATORY_CARE_PROVIDER_SITE_OTHER): Payer: 59 | Admitting: Family Medicine

## 2016-12-05 VITALS — BP 116/80 | HR 73 | Temp 98.6°F | Resp 12 | Ht 63.5 in | Wt 155.2 lb

## 2016-12-05 DIAGNOSIS — R0989 Other specified symptoms and signs involving the circulatory and respiratory systems: Secondary | ICD-10-CM | POA: Diagnosis not present

## 2016-12-05 DIAGNOSIS — R05 Cough: Secondary | ICD-10-CM

## 2016-12-05 DIAGNOSIS — J989 Respiratory disorder, unspecified: Secondary | ICD-10-CM

## 2016-12-05 DIAGNOSIS — R059 Cough, unspecified: Secondary | ICD-10-CM

## 2016-12-05 MED ORDER — ALBUTEROL SULFATE HFA 108 (90 BASE) MCG/ACT IN AERS: 2.0000 | INHALATION_SPRAY | Freq: Four times a day (QID) | RESPIRATORY_TRACT | 0 refills | Status: DC | PRN

## 2016-12-05 NOTE — Progress Notes (Signed)
HPI:   Ellen Hayden is a 51 y.o. female, who is here today to establish care.  Former PCP: Dr Regis Bill Last preventive routine visit: Follows with gyn regularly.  Chronic medical problems: Migraine and rhinitis among some.   Concerns today: Cough.  6 days of chest congestion.   Productive cough with occasional "little" greenish sputum.  No fever, chill, or myalgias. No nasal congestion, rhinorrhea, sore throat, orpost nasal drainage.  Denies chest pain or dyspnea. "Little" wheezing.  She denies history of asthma.  No Hx of recent travel. + Sick contact, husband URI. No known insect bite.  No heartburn. Hx of GERD a few years ago.   OTC medications for this problem: none Symptoms otherwise stable.   Review of Systems  Constitutional: Negative for activity change, appetite change, fatigue and fever.  HENT: Negative for congestion, mouth sores, postnasal drip, rhinorrhea, sinus pressure, sore throat, trouble swallowing and voice change.   Eyes: Negative for discharge, redness and itching.  Respiratory: Positive for cough and wheezing. Negative for shortness of breath.   Cardiovascular: Negative for chest pain, palpitations and leg swelling.  Gastrointestinal: Negative for abdominal pain, diarrhea, nausea and vomiting.  Musculoskeletal: Negative for back pain, joint swelling, myalgias and neck pain.  Skin: Negative for rash.  Allergic/Immunologic: Positive for environmental allergies.  Neurological: Positive for headaches (Last night,mild). Negative for syncope and weakness.  Hematological: Negative for adenopathy. Does not bruise/bleed easily.      Current Outpatient Medications on File Prior to Visit  Medication Sig Dispense Refill  . acetaminophen (TYLENOL) 325 MG tablet Take 2 tablets (650 mg total) by mouth every 6 (six) hours as needed for mild pain, moderate pain, fever or headache.    . Cholecalciferol (VITAMIN D PO) Take by mouth.    .  norethindrone-ethinyl estradiol (JUNEL 1/20) 1-20 MG-MCG tablet Take 1 tablet by mouth daily. 84 tablet 3   No current facility-administered medications on file prior to visit.      Past Medical History:  Diagnosis Date  . Cervical dysplasia 2000   mild dysplasia with no treatment and normal paps since   Allergies  Allergen Reactions  . Ciprofloxacin     REACTION: rash when had fever    Family History  Problem Relation Age of Onset  . Hypertension Father   . Cancer Maternal Grandmother        THROAT - SMOKER  . Cancer Paternal Grandmother        LIVER    Social History   Socioeconomic History  . Marital status: Married    Spouse name: None  . Number of children: None  . Years of education: None  . Highest education level: None  Social Needs  . Financial resource strain: None  . Food insecurity - worry: None  . Food insecurity - inability: None  . Transportation needs - medical: None  . Transportation needs - non-medical: None  Occupational History  . None  Tobacco Use  . Smoking status: Never Smoker  . Smokeless tobacco: Never Used  Substance and Sexual Activity  . Alcohol use: Yes    Alcohol/week: 0.0 oz    Comment: Rare  . Drug use: No  . Sexual activity: Yes    Birth control/protection: Other-see comments    Comment: VASECTOMY-1st intercourse 51 yo-More than 5 partners  Other Topics Concern  . None  Social History Narrative  . None    Vitals:   12/05/16 1546  BP: 116/80  Pulse: 73  Resp: 12  Temp: 98.6 F (37 C)  SpO2: 98%   Body mass index is 27.07 kg/m.   Physical Exam  Nursing note and vitals reviewed. Constitutional: She is oriented to person, place, and time. She appears well-developed. No distress.  HENT:  Head: Normocephalic and atraumatic.  Mouth/Throat: Oropharynx is clear and moist and mucous membranes are normal.  Postnasal drainage.  Eyes: Conjunctivae are normal. Pupils are equal, round, and reactive to light.    Cardiovascular: Normal rate and regular rhythm.  No murmur heard. Pulses:      Dorsalis pedis pulses are 2+ on the right side, and 2+ on the left side.  Respiratory: Effort normal. No respiratory distress. She has no rhonchi. She has no rales.  Prolonged expiration and sporadic wheezing.  GI: Soft. She exhibits no mass. There is no hepatomegaly. There is no tenderness.  Musculoskeletal: She exhibits no edema or tenderness.  Lymphadenopathy:    She has no cervical adenopathy.  Neurological: She is alert and oriented to person, place, and time. She has normal strength. Gait normal.  Skin: Skin is warm. No rash noted. No erythema.  Psychiatric: She has a normal mood and affect.  Well groomed, good eye contact.     ASSESSMENT AND PLAN:   Ms. Ellen Hayden was seen today for establish care.  Diagnoses and all orders for this visit:  Reactive airway disease that is not asthma  Lung auscultation today no rales. Most likely related to viral illness. Albuterol inh 2 puff every 6 hours for a week then as needed for wheezing or shortness of breath.  She is not interested in Prednisone. CXR ordered, further recommendations will be given accordingly. F/U as needed. Instructed about warning signs.  -     DG Chest 2 View; Future -     albuterol (PROVENTIL HFA;VENTOLIN HFA) 108 (90 Base) MCG/ACT inhaler; Inhale 2 puffs into the lungs every 6 (six) hours as needed for wheezing or shortness of breath.  Cough  Viral URI most likely. All other possible causes dicussed: Allergies on GERD. Further recommendations will be given according to imaging results.   -     DG Chest 2 View; Future      Betty G. Martinique, MD  [facility]. [place] office.

## 2016-12-05 NOTE — Patient Instructions (Signed)
A few things to remember from today's visit:   Reactive airway disease that is not asthma - Plan: DG Chest 2 View, albuterol (PROVENTIL HFA;VENTOLIN HFA) 108 (90 Base) MCG/ACT inhaler  Albuterol inh 2 puff every 6 hours for a week then as needed for wheezing or shortness of breath.  Today X ray was ordered.  This can be done at [facility] at [facility] between 8 am and 5 pm: [place]. [phone].   Please be sure medication list is accurate. If a new problem present, please set up appointment sooner than planned today.

## 2016-12-07 ENCOUNTER — Encounter: Payer: Self-pay | Admitting: Family Medicine

## 2017-04-29 ENCOUNTER — Other Ambulatory Visit: Payer: Self-pay | Admitting: Gynecology

## 2017-04-29 DIAGNOSIS — Z1231 Encounter for screening mammogram for malignant neoplasm of breast: Secondary | ICD-10-CM

## 2017-05-06 ENCOUNTER — Ambulatory Visit
Admission: RE | Admit: 2017-05-06 | Discharge: 2017-05-06 | Disposition: A | Discharge status: Home / Self Care | Payer: 59 | Source: Ambulatory Visit | Attending: Gynecology | Admitting: Gynecology

## 2017-05-06 DIAGNOSIS — Z1231 Encounter for screening mammogram for malignant neoplasm of breast: Secondary | ICD-10-CM

## 2017-06-30 ENCOUNTER — Other Ambulatory Visit: Payer: Self-pay | Admitting: Gynecology

## 2017-07-12 ENCOUNTER — Encounter: Payer: 59 | Admitting: Gynecology

## 2017-07-19 ENCOUNTER — Encounter: Payer: Self-pay | Admitting: Gynecology

## 2017-07-19 ENCOUNTER — Ambulatory Visit (INDEPENDENT_AMBULATORY_CARE_PROVIDER_SITE_OTHER): Payer: 59 | Admitting: Gynecology

## 2017-07-19 VITALS — BP 118/76 | Ht 63.5 in | Wt 155.0 lb

## 2017-07-19 DIAGNOSIS — Z01419 Encounter for gynecological examination (general) (routine) without abnormal findings: Secondary | ICD-10-CM

## 2017-07-19 DIAGNOSIS — Z13220 Encounter for screening for lipoid disorders: Secondary | ICD-10-CM | POA: Diagnosis not present

## 2017-07-19 LAB — COMPREHENSIVE METABOLIC PANEL
AG Ratio: 1.8 (calc) (ref 1.0–2.5)
ALKALINE PHOSPHATASE (APISO): 45 U/L (ref 33–130)
ALT: 16 U/L (ref 6–29)
AST: 19 U/L (ref 10–35)
Albumin: 4.3 g/dL (ref 3.6–5.1)
BUN: 21 mg/dL (ref 7–25)
CO2: 25 mmol/L (ref 20–32)
CREATININE: 0.85 mg/dL (ref 0.50–1.05)
Calcium: 9.8 mg/dL (ref 8.6–10.4)
Chloride: 101 mmol/L (ref 98–110)
GLUCOSE: 84 mg/dL (ref 65–99)
Globulin: 2.4 g/dL (calc) (ref 1.9–3.7)
Potassium: 4.9 mmol/L (ref 3.5–5.3)
Sodium: 138 mmol/L (ref 135–146)
Total Bilirubin: 0.7 mg/dL (ref 0.2–1.2)
Total Protein: 6.7 g/dL (ref 6.1–8.1)

## 2017-07-19 LAB — CBC WITH DIFFERENTIAL/PLATELET
BASOS ABS: 44 {cells}/uL (ref 0–200)
Basophils Relative: 0.3 %
EOS ABS: 44 {cells}/uL (ref 15–500)
Eosinophils Relative: 0.3 %
HEMATOCRIT: 38.2 % (ref 35.0–45.0)
HEMOGLOBIN: 12.7 g/dL (ref 11.7–15.5)
LYMPHS ABS: 3396 {cells}/uL (ref 850–3900)
MCH: 31.1 pg (ref 27.0–33.0)
MCHC: 33.2 g/dL (ref 32.0–36.0)
MCV: 93.4 fL (ref 80.0–100.0)
MPV: 10.5 fL (ref 7.5–12.5)
Monocytes Relative: 6.1 %
NEUTROS PCT: 70.2 %
Neutro Abs: 10319 cells/uL — ABNORMAL HIGH (ref 1500–7800)
Platelets: 260 10*3/uL (ref 140–400)
RBC: 4.09 10*6/uL (ref 3.80–5.10)
RDW: 12.6 % (ref 11.0–15.0)
Total Lymphocyte: 23.1 %
WBC: 14.7 10*3/uL — AB (ref 3.8–10.8)
WBCMIX: 897 {cells}/uL (ref 200–950)

## 2017-07-19 LAB — LIPID PANEL
CHOLESTEROL: 167 mg/dL (ref <200)
HDL: 62 mg/dL (ref >50)
LDL Cholesterol (Calc): 84 mg/dL (calc)
Non-HDL Cholesterol (Calc): 105 mg/dL (calc) (ref <130)
TRIGLYCERIDES: 109 mg/dL (ref <150)
Total CHOL/HDL Ratio: 2.7 (calc) (ref <5.0)

## 2017-07-19 MED ORDER — NORETHINDRONE ACET-ETHINYL EST 1-20 MG-MCG PO TABS: 1.0000 | ORAL_TABLET | Freq: Every day | ORAL | 5 refills | Status: DC

## 2017-07-19 NOTE — Patient Instructions (Signed)
Follow-up in 1 year for annual exam, sooner as needed. 

## 2017-07-19 NOTE — Progress Notes (Signed)
    Ellen Hayden 15-Jan-1966 [id number]        52 y.o.  [id number] for annual gynecologic exam.  Doing well without complaints  Past medical history,surgical history, problem list, medications, allergies, family history and social history were all reviewed and documented as reviewed in the EPIC chart.  ROS:  Performed with pertinent positives and negatives included in the history, assessment and plan.   Additional significant findings : None   Exam: Caryn Bee assistant Vitals:   07/19/17 1131  BP: 118/76  Weight: 155 lb (70.3 kg)  Height: 5' 3.5" (1.613 m)   Body mass index is 27.03 kg/m.  General appearance:  Normal affect, orientation and appearance. Skin: Grossly normal HEENT: Without gross lesions.  No cervical or supraclavicular adenopathy. Thyroid normal.  Lungs:  Clear without wheezing, rales or rhonchi Cardiac: RR, without RMG Abdominal:  Soft, nontender, without masses, guarding, rebound, organomegaly or hernia Breasts:  Examined lying and sitting without masses, retractions, discharge or axillary adenopathy. Pelvic:  Ext, BUS, Vagina: Normal  Cervix: Normal  Uterus: Anteverted, normal size, shape and contour, midline and mobile nontender   Adnexa: Without masses or tenderness    Anus and perineum: Normal   Rectovaginal: Normal sphincter tone without palpated masses or tenderness.    Assessment/Plan:  52 y.o. [id number] female for annual gynecologic exam with regular menses, oral contraceptives.   1. Continues on Loestrin 1/20 equivalent.  Has menses every several months.  Had [facility] last year during pill free week which was normal.  We discussed various options to include stopping the pill altogether, rechecking [facility] during pill free week or continuing on the pills another year.  We have discussed the risks to include thrombosis such as stroke heart attack DVT.  At this point the patient wants to continue another year and I think this is certainly reasonable when I  refilled her x1 year. 2. Mammography 04/2017.  Continue with annual mammography when due.  Breast exam normal today. 3. Pap smear/HPV 2018.  No Pap smear done today.  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  Plan repeat Pap smear/HPV at 5-year interval per current screening guidelines. 4. Health maintenance.  Baseline CBC, CMP, lipid profile and urine analysis ordered.  TSH normal last year.  Follow-up in 1 year, sooner as needed.   Anastasio Auerbach MD, 12:46 PM 07/19/2017

## 2017-07-21 LAB — URINALYSIS, COMPLETE W/RFL CULTURE
Bacteria, UA: NONE SEEN /HPF
Bilirubin Urine: NEGATIVE
Glucose, UA: NEGATIVE
Hgb urine dipstick: NEGATIVE
Hyaline Cast: NONE SEEN /LPF
KETONES UR: NEGATIVE
LEUKOCYTE ESTERASE: NEGATIVE
Nitrites, Initial: NEGATIVE
Protein, ur: NEGATIVE
SPECIFIC GRAVITY, URINE: 1.021 (ref 1.001–1.03)
SQUAMOUS EPITHELIAL / LPF: NONE SEEN /HPF (ref <=5)
WBC, UA: NONE SEEN /HPF (ref 0–5)
pH: >=8.5 — AB (ref 5.0–8.0)

## 2017-07-21 LAB — URINE CULTURE
MICRO NUMBER: 90802833
Result:: NO GROWTH
SPECIMEN QUALITY: ADEQUATE

## 2017-07-21 LAB — CULTURE INDICATED

## 2017-07-22 ENCOUNTER — Other Ambulatory Visit: Payer: Self-pay | Admitting: Gynecology

## 2017-07-22 DIAGNOSIS — D72828 Other elevated white blood cell count: Secondary | ICD-10-CM

## 2017-07-22 NOTE — Telephone Encounter (Signed)
Not unusual to see bumps in the white count.  I just wanted to recheck it at some point to make sure that it comes back to normal.

## 2017-08-01 ENCOUNTER — Encounter: Payer: Self-pay | Admitting: Family Medicine

## 2017-08-01 LAB — HM COLONOSCOPY

## 2017-08-02 ENCOUNTER — Encounter: Payer: Self-pay | Admitting: Family Medicine

## 2017-09-04 ENCOUNTER — Other Ambulatory Visit: Payer: 59

## 2017-09-04 DIAGNOSIS — D72828 Other elevated white blood cell count: Secondary | ICD-10-CM

## 2017-09-04 LAB — CBC WITH DIFFERENTIAL/PLATELET
Basophils Absolute: 50 cells/uL (ref 0–200)
Basophils Relative: 0.4 %
EOS ABS: 75 {cells}/uL (ref 15–500)
EOS PCT: 0.6 %
HCT: 39.1 % (ref 35.0–45.0)
HEMOGLOBIN: 12.8 g/dL (ref 11.7–15.5)
Lymphs Abs: 3100 cells/uL (ref 850–3900)
MCH: 31.2 pg (ref 27.0–33.0)
MCHC: 32.7 g/dL (ref 32.0–36.0)
MCV: 95.4 fL (ref 80.0–100.0)
MONOS PCT: 4.9 %
MPV: 11.5 fL (ref 7.5–12.5)
NEUTROS ABS: 8663 {cells}/uL — AB (ref 1500–7800)
Neutrophils Relative %: 69.3 %
PLATELETS: 200 10*3/uL (ref 140–400)
RBC: 4.1 10*6/uL (ref 3.80–5.10)
RDW: 12.7 % (ref 11.0–15.0)
Total Lymphocyte: 24.8 %
WBC mixed population: 613 cells/uL (ref 200–950)
WBC: 12.5 10*3/uL — AB (ref 3.8–10.8)

## 2017-09-05 ENCOUNTER — Encounter: Payer: Self-pay | Admitting: Gynecology

## 2018-03-28 ENCOUNTER — Other Ambulatory Visit: Payer: Self-pay | Admitting: *Deleted

## 2018-03-28 MED ORDER — NORETHINDRONE ACET-ETHINYL EST 1-20 MG-MCG PO TABS
1.0000 | ORAL_TABLET | Freq: Every day | ORAL | 1 refills | Status: DC
Start: 2018-03-28 — End: 2018-07-15

## 2018-06-06 ENCOUNTER — Other Ambulatory Visit: Payer: Self-pay | Admitting: Gynecology

## 2018-06-06 DIAGNOSIS — Z1231 Encounter for screening mammogram for malignant neoplasm of breast: Secondary | ICD-10-CM

## 2018-07-14 ENCOUNTER — Other Ambulatory Visit: Payer: Self-pay | Admitting: Gynecology

## 2018-07-15 NOTE — Telephone Encounter (Signed)
Annual exam is scheduled for 07/24/18.

## 2018-07-23 ENCOUNTER — Other Ambulatory Visit: Payer: Self-pay

## 2018-07-24 ENCOUNTER — Encounter: Payer: Self-pay | Admitting: Gynecology

## 2018-07-24 ENCOUNTER — Ambulatory Visit (INDEPENDENT_AMBULATORY_CARE_PROVIDER_SITE_OTHER): Payer: Commercial Managed Care - PPO | Admitting: Gynecology

## 2018-07-24 VITALS — BP 116/76 | Ht 63.5 in | Wt 159.0 lb

## 2018-07-24 DIAGNOSIS — Z3049 Encounter for surveillance of other contraceptives: Secondary | ICD-10-CM

## 2018-07-24 DIAGNOSIS — Z01419 Encounter for gynecological examination (general) (routine) without abnormal findings: Secondary | ICD-10-CM | POA: Diagnosis not present

## 2018-07-24 DIAGNOSIS — Z13220 Encounter for screening for lipoid disorders: Secondary | ICD-10-CM | POA: Diagnosis not present

## 2018-07-24 MED ORDER — NORETHINDRONE ACET-ETHINYL EST 1-20 MG-MCG PO TABS: 1.0000 | ORAL_TABLET | Freq: Every day | ORAL | 0 refills | Status: DC

## 2018-07-24 NOTE — Patient Instructions (Signed)
Stop your birth control pills for 2 weeks and then check the [facility] hormone blood test.  We will then decide what to do about the birth control pills.  Follow-up for annual exam in 1 year

## 2018-07-24 NOTE — Progress Notes (Signed)
    Ellen Hayden Jul 19, 1965 [id number]        53 y.o.  [id number] for annual gynecologic exam.  Without gynecologic complaints.  Continuing on continuous low-dose oral contraceptives.  Vernal checked last year off of the pills was normal.  Past medical history,surgical history, problem list, medications, allergies, family history and social history were all reviewed and documented as reviewed in the EPIC chart.  ROS:  Performed with pertinent positives and negatives included in the history, assessment and plan.   Additional significant findings : None   Exam: Caryn Bee assistant Vitals:   07/24/18 0856  BP: 116/76  Weight: 159 lb (72.1 kg)  Height: 5' 3.5" (1.613 m)   Body mass index is 27.72 kg/m.  General appearance:  Normal affect, orientation and appearance. Skin: Grossly normal HEENT: Without gross lesions.  No cervical or supraclavicular adenopathy. Thyroid normal.  Lungs:  Clear without wheezing, rales or rhonchi Cardiac: RR, without RMG Abdominal:  Soft, nontender, without masses, guarding, rebound, organomegaly or hernia Breasts:  Examined lying and sitting without masses, retractions, discharge or axillary adenopathy. Pelvic:  Ext, BUS, Vagina: Normal  Cervix: Normal  Uterus: Anteverted, normal size, shape and contour, midline and mobile nontender   Adnexa: Without masses or tenderness    Anus and perineum: Normal   Rectovaginal: Normal sphincter tone without palpated masses or tenderness.    Assessment/Plan:  53 y.o. [id number] female for annual gynecologic exam.   1. Continuous oral contraceptives.  Has menses every several months.  Will stop her pills for 2 weeks and check an [facility].  Future order placed for her to return and have this drawn.  Various scenarios based upon these results were discussed with the patient.  I refilled her pills x3 months and then will make a decision as to whether continue them or not based on her [facility] and symptoms/bleeding off of the  pills.  We again discussed possible increased risks of thrombosis associated with low-dose oral contraceptives. 2. Mammography scheduled tomorrow.  Breast exam normal today. 3. Pap smear/HPV 2018.  No Pap smear done today.  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  Plan follow-up Pap smear/HPV at 5-year interval per current screening guidelines. 4. Health maintenance.  Requests baseline labs.  CBC, CMP and lipid profile ordered.  Follow-up 1 year, sooner as needed.   Anastasio Auerbach MD, 9:31 AM 07/24/2018

## 2018-07-25 ENCOUNTER — Ambulatory Visit
Admission: RE | Admit: 2018-07-25 | Discharge: 2018-07-25 | Disposition: A | Discharge status: Home / Self Care | Payer: Commercial Managed Care - PPO | Source: Ambulatory Visit | Attending: Gynecology | Admitting: Gynecology

## 2018-07-25 DIAGNOSIS — Z1231 Encounter for screening mammogram for malignant neoplasm of breast: Secondary | ICD-10-CM

## 2018-07-25 LAB — COMPREHENSIVE METABOLIC PANEL
AG Ratio: 1.8 (calc) (ref 1.0–2.5)
ALT: 24 U/L (ref 6–29)
AST: 19 U/L (ref 10–35)
Albumin: 4.4 g/dL (ref 3.6–5.1)
Alkaline phosphatase (APISO): 40 U/L (ref 37–153)
BUN/Creatinine Ratio: 17 (calc) (ref 6–22)
BUN: 18 mg/dL (ref 7–25)
CO2: 26 mmol/L (ref 20–32)
Calcium: 9.2 mg/dL (ref 8.6–10.4)
Chloride: 102 mmol/L (ref 98–110)
Creat: 1.06 mg/dL — ABNORMAL HIGH (ref 0.50–1.05)
Globulin: 2.5 g/dL (calc) (ref 1.9–3.7)
Glucose, Bld: 84 mg/dL (ref 65–99)
Potassium: 4 mmol/L (ref 3.5–5.3)
Sodium: 139 mmol/L (ref 135–146)
Total Bilirubin: 0.7 mg/dL (ref 0.2–1.2)
Total Protein: 6.9 g/dL (ref 6.1–8.1)

## 2018-07-25 LAB — CBC WITH DIFFERENTIAL/PLATELET
Absolute Monocytes: 583 cells/uL (ref 200–950)
Basophils Absolute: 42 cells/uL (ref 0–200)
Basophils Relative: 0.4 %
Eosinophils Absolute: 64 cells/uL (ref 15–500)
Eosinophils Relative: 0.6 %
HCT: 39.2 % (ref 35.0–45.0)
Hemoglobin: 13.2 g/dL (ref 11.7–15.5)
Lymphs Abs: 2184 cells/uL (ref 850–3900)
MCH: 32 pg (ref 27.0–33.0)
MCHC: 33.7 g/dL (ref 32.0–36.0)
MCV: 95.1 fL (ref 80.0–100.0)
MPV: 11 fL (ref 7.5–12.5)
Monocytes Relative: 5.5 %
Neutro Abs: 7727 cells/uL (ref 1500–7800)
Neutrophils Relative %: 72.9 %
Platelets: 252 10*3/uL (ref 140–400)
RBC: 4.12 10*6/uL (ref 3.80–5.10)
RDW: 12.8 % (ref 11.0–15.0)
Total Lymphocyte: 20.6 %
WBC: 10.6 10*3/uL (ref 3.8–10.8)

## 2018-07-25 LAB — LIPID PANEL
Cholesterol: 175 mg/dL (ref <200)
HDL: 60 mg/dL (ref >=50)
LDL Cholesterol (Calc): 95 mg/dL (calc)
Non-HDL Cholesterol (Calc): 115 mg/dL (calc) (ref <130)
Total CHOL/HDL Ratio: 2.9 (calc) (ref <5.0)
Triglycerides: 100 mg/dL (ref <150)

## 2018-07-28 ENCOUNTER — Other Ambulatory Visit: Payer: Self-pay | Admitting: Gynecology

## 2018-07-28 DIAGNOSIS — R928 Other abnormal and inconclusive findings on diagnostic imaging of breast: Secondary | ICD-10-CM

## 2018-07-31 ENCOUNTER — Ambulatory Visit
Admission: RE | Admit: 2018-07-31 | Discharge: 2018-07-31 | Disposition: A | Discharge status: Home / Self Care | Payer: Commercial Managed Care - PPO | Source: Ambulatory Visit | Attending: Gynecology | Admitting: Gynecology

## 2018-07-31 ENCOUNTER — Other Ambulatory Visit: Payer: Self-pay

## 2018-07-31 ENCOUNTER — Other Ambulatory Visit: Payer: Self-pay | Admitting: Gynecology

## 2018-07-31 DIAGNOSIS — R928 Other abnormal and inconclusive findings on diagnostic imaging of breast: Secondary | ICD-10-CM

## 2018-07-31 DIAGNOSIS — N6320 Unspecified lump in the left breast, unspecified quadrant: Secondary | ICD-10-CM

## 2018-09-02 ENCOUNTER — Other Ambulatory Visit: Payer: Self-pay | Admitting: Gynecology

## 2018-09-19 ENCOUNTER — Other Ambulatory Visit: Payer: Self-pay | Admitting: Gynecology

## 2018-10-08 ENCOUNTER — Encounter: Payer: Self-pay | Admitting: Gynecology

## 2018-12-27 ENCOUNTER — Other Ambulatory Visit: Payer: Self-pay | Admitting: Gynecology

## 2018-12-31 ENCOUNTER — Other Ambulatory Visit: Payer: Self-pay | Admitting: Gynecology

## 2019-01-01 ENCOUNTER — Telehealth: Payer: Self-pay | Admitting: *Deleted

## 2019-01-01 NOTE — Telephone Encounter (Signed)
Patient called asked why her birth control pills got denied, I called patient back and left detailed message on cell not on 07/24/18 "Will stop her pills for 2 weeks and check an [facility].  Future order placed for her to return and have this drawn.  Various scenarios based upon these results were discussed with the patient.  I refilled her pills x3 months and then will make a decision as to whether continue them or not based on her [facility] and symptoms/bleeding off of the pills."  I asked patient to schedule lab appointment.

## 2019-01-11 ENCOUNTER — Encounter: Payer: Self-pay | Admitting: Gynecology

## 2019-01-12 ENCOUNTER — Other Ambulatory Visit: Payer: Commercial Managed Care - PPO

## 2019-01-12 ENCOUNTER — Other Ambulatory Visit: Payer: Self-pay

## 2019-01-12 DIAGNOSIS — Z3049 Encounter for surveillance of other contraceptives: Secondary | ICD-10-CM

## 2019-01-12 LAB — FOLLICLE STIMULATING HORMONE: FSH: 19.6 m[IU]/mL

## 2019-01-12 NOTE — Telephone Encounter (Signed)
Okay to add those labs to her [facility] draw.  Remind patient though that I will not be available to review them as I am retiring.  I would put Dr Assunta Curtis name on the labs

## 2019-01-13 ENCOUNTER — Encounter: Payer: Self-pay | Admitting: Gynecology

## 2019-01-13 MED ORDER — NORETHINDRONE ACET-ETHINYL EST 1-20 MG-MCG PO TABS: 1.0000 | ORAL_TABLET | Freq: Every day | ORAL | 2 refills | Status: DC

## 2019-02-04 ENCOUNTER — Other Ambulatory Visit: Payer: Self-pay

## 2019-02-04 ENCOUNTER — Ambulatory Visit
Admission: RE | Admit: 2019-02-04 | Discharge: 2019-02-04 | Disposition: A | Discharge status: Home / Self Care | Payer: Commercial Managed Care - PPO | Source: Ambulatory Visit | Attending: Gynecology | Admitting: Gynecology

## 2019-02-04 DIAGNOSIS — N6320 Unspecified lump in the left breast, unspecified quadrant: Secondary | ICD-10-CM

## 2019-06-19 ENCOUNTER — Other Ambulatory Visit: Payer: Self-pay | Admitting: Obstetrics and Gynecology

## 2019-06-19 DIAGNOSIS — Z1231 Encounter for screening mammogram for malignant neoplasm of breast: Secondary | ICD-10-CM

## 2019-07-27 ENCOUNTER — Other Ambulatory Visit: Payer: Self-pay

## 2019-07-27 ENCOUNTER — Ambulatory Visit
Admission: RE | Admit: 2019-07-27 | Discharge: 2019-07-27 | Disposition: A | Discharge status: Home / Self Care | Payer: Commercial Managed Care - PPO | Source: Ambulatory Visit | Attending: Obstetrics and Gynecology | Admitting: Obstetrics and Gynecology

## 2019-07-27 DIAGNOSIS — Z1231 Encounter for screening mammogram for malignant neoplasm of breast: Secondary | ICD-10-CM

## 2019-07-28 ENCOUNTER — Other Ambulatory Visit: Payer: Self-pay

## 2019-07-28 DIAGNOSIS — D649 Anemia, unspecified: Secondary | ICD-10-CM | POA: Insufficient documentation

## 2019-07-28 DIAGNOSIS — G2581 Restless legs syndrome: Secondary | ICD-10-CM | POA: Insufficient documentation

## 2019-07-28 MED ORDER — NORETHINDRONE ACET-ETHINYL EST 1-20 MG-MCG PO TABS: 1.0000 | ORAL_TABLET | Freq: Every day | ORAL | 0 refills | Status: AC

## 2019-07-28 NOTE — Telephone Encounter (Signed)
I sent Ellen Hayden a staff message asking her to call patient and schedule CE as she is due this month. Refill sent.

## 2020-08-26 ENCOUNTER — Other Ambulatory Visit: Payer: Self-pay | Admitting: Family Medicine

## 2020-08-26 DIAGNOSIS — Z1231 Encounter for screening mammogram for malignant neoplasm of breast: Secondary | ICD-10-CM

## 2020-09-08 ENCOUNTER — Ambulatory Visit
Admission: RE | Admit: 2020-09-08 | Discharge: 2020-09-08 | Disposition: A | Discharge status: Home / Self Care | Payer: Commercial Managed Care - PPO | Source: Ambulatory Visit | Attending: Family Medicine | Admitting: Family Medicine

## 2020-09-08 ENCOUNTER — Other Ambulatory Visit: Payer: Self-pay

## 2020-09-08 DIAGNOSIS — Z1231 Encounter for screening mammogram for malignant neoplasm of breast: Secondary | ICD-10-CM

## 2020-09-08 LAB — RESULTS CONSOLE HPV: CHL HPV: NEGATIVE

## 2020-09-08 LAB — HM MAMMOGRAPHY

## 2020-09-08 LAB — HM PAP SMEAR: HM Pap smear: NEGATIVE

## 2021-02-22 ENCOUNTER — Encounter: Payer: Self-pay | Admitting: Podiatry

## 2021-02-22 ENCOUNTER — Ambulatory Visit (INDEPENDENT_AMBULATORY_CARE_PROVIDER_SITE_OTHER): Payer: BC Managed Care – PPO | Admitting: Podiatry

## 2021-02-22 ENCOUNTER — Ambulatory Visit (INDEPENDENT_AMBULATORY_CARE_PROVIDER_SITE_OTHER): Payer: BC Managed Care – PPO

## 2021-02-22 ENCOUNTER — Other Ambulatory Visit: Payer: Self-pay

## 2021-02-22 DIAGNOSIS — M722 Plantar fascial fibromatosis: Secondary | ICD-10-CM

## 2021-02-22 DIAGNOSIS — M21619 Bunion of unspecified foot: Secondary | ICD-10-CM | POA: Diagnosis not present

## 2021-02-22 MED ORDER — DICLOFENAC SODIUM 75 MG PO TBEC: 75.0000 mg | DELAYED_RELEASE_TABLET | Freq: Two times a day (BID) | ORAL | 2 refills | Status: AC

## 2021-02-22 MED ORDER — TRIAMCINOLONE ACETONIDE 10 MG/ML IJ SUSP
10.0000 mg | Freq: Once | INTRAMUSCULAR | Status: AC
  Administered 2021-02-22: 10 mg

## 2021-02-22 NOTE — Progress Notes (Signed)
Subjective:   Patient ID: Ellen Hayden, female   DOB: 56 y.o.   MRN: [id number]   HPI Patient presents stating she developed a lot of pain in the bottom of her right heel aches and sore when she tries to be active.  Patient states she likes to be active and does not smoke   Review of Systems  All other systems reviewed and are negative.      Objective:  Physical Exam Vitals and nursing note reviewed.  Constitutional:      Appearance: She is well-developed.  Pulmonary:     Effort: Pulmonary effort is normal.  Musculoskeletal:        General: Normal range of motion.  Skin:    General: Skin is warm.  Neurological:     Mental Status: She is alert.    Neurovascular status found to be intact muscle strength was found to be adequate range of motion adequate patient found to have exquisite discomfort plantar aspect right heel at the insertional point of the tendon into the calcaneus with inflammation fluid buildup.  Patient has good digital perfusion well oriented x3     Assessment:  Acute plantar fasciitis right with inflammation fluid of the medial band     Plan:  H&P x-ray reviewed sterile prep and injected the plantar fascia right 3 mg Kenalog 5 mg Xylocaine and instructed on anti-inflammatories.  Dispensed fascial brace with instructions  X-rays indicate no signs of spur no stress fracture noted

## 2021-02-22 NOTE — Progress Notes (Signed)
dg 

## 2021-02-22 NOTE — Patient Instructions (Signed)

## 2021-03-09 ENCOUNTER — Ambulatory Visit (INDEPENDENT_AMBULATORY_CARE_PROVIDER_SITE_OTHER): Payer: BC Managed Care – PPO | Admitting: Podiatry

## 2021-03-09 ENCOUNTER — Other Ambulatory Visit: Payer: Self-pay

## 2021-03-09 ENCOUNTER — Encounter: Payer: Self-pay | Admitting: Podiatry

## 2021-03-09 DIAGNOSIS — M722 Plantar fascial fibromatosis: Secondary | ICD-10-CM

## 2021-03-09 DIAGNOSIS — M21619 Bunion of unspecified foot: Secondary | ICD-10-CM

## 2021-03-09 NOTE — Progress Notes (Signed)
Subjective:   Patient ID: Ellen Hayden, female   DOB: 56 y.o.   MRN: [id number]   HPI Patient states she is significantly improved with diminishment of discomfort plantar aspect right heel and only minimal pain with pressure   ROS      Objective:  Physical Exam  Vascular status intact with patient right plantar fascia doing very well mild discomfort but overall significantly improved from before first treatment     Assessment:  Doing well post treatment for acute Planter fasciitis right     Plan:  Discussed continued wearing a brace usage of anti-inflammatories shoe gear modification stretching.  At this point I will see back on an as-needed basis encouraged her to call with questions concerns or come in

## 2021-03-20 DIAGNOSIS — S0121XA Laceration without foreign body of nose, initial encounter: Secondary | ICD-10-CM | POA: Diagnosis not present

## 2021-03-20 DIAGNOSIS — W51XXXA Accidental striking against or bumped into by another person, initial encounter: Secondary | ICD-10-CM | POA: Diagnosis not present

## 2021-03-20 DIAGNOSIS — Y9369 Activity, other involving other sports and athletics played as a team or group: Secondary | ICD-10-CM | POA: Diagnosis not present

## 2021-03-20 DIAGNOSIS — S0992XA Unspecified injury of nose, initial encounter: Secondary | ICD-10-CM | POA: Diagnosis not present

## 2021-03-20 DIAGNOSIS — S0993XA Unspecified injury of face, initial encounter: Secondary | ICD-10-CM | POA: Diagnosis not present

## 2021-03-20 DIAGNOSIS — S022XXA Fracture of nasal bones, initial encounter for closed fracture: Secondary | ICD-10-CM | POA: Diagnosis not present

## 2021-06-23 ENCOUNTER — Encounter: Payer: Self-pay | Admitting: Podiatry

## 2021-06-23 ENCOUNTER — Ambulatory Visit (INDEPENDENT_AMBULATORY_CARE_PROVIDER_SITE_OTHER): Payer: BC Managed Care – PPO | Admitting: Podiatry

## 2021-06-23 DIAGNOSIS — M21619 Bunion of unspecified foot: Secondary | ICD-10-CM

## 2021-06-23 DIAGNOSIS — M722 Plantar fascial fibromatosis: Secondary | ICD-10-CM

## 2021-06-23 MED ORDER — TRIAMCINOLONE ACETONIDE 10 MG/ML IJ SUSP
10.0000 mg | Freq: Once | INTRAMUSCULAR | Status: AC
  Administered 2021-06-23: 10 mg

## 2021-06-23 NOTE — Progress Notes (Signed)
Subjective:   Patient ID: Ellen Hayden, female   DOB: 56 y.o.   MRN: [id number]   HPI Patient states she has had significant reoccurrence Planter fasciitis over the last month and admits that she has been very active but is not running like she would like to.  She is flat-footed does have bunion deformity right   ROS      Objective:  Physical Exam  Neurovascular status intact exquisite discomfort distal to the plantar fascial insertion right calcaneus with flatfoot deformity bunion deformity that is been present for a long period of time     Assessment:  Acute plantar fasciitis right reoccurrence with patient who has expectations of high activity     Plan:  H&P reviewed condition and discussed orthotics and recommended long-term orthotics and casted for functional orthotic devices.  I then went ahead and I did do sterile prep and injected the fascia right 3 mg Kenalog 5 mg Xylocaine applied dressing and patient will be seen back for orthotic dispensing or earlier if needed and this will be done by pedorthist.  I did build the orthotics up by 03/30/1967

## 2021-07-27 ENCOUNTER — Ambulatory Visit (INDEPENDENT_AMBULATORY_CARE_PROVIDER_SITE_OTHER): Payer: BC Managed Care – PPO

## 2021-07-27 DIAGNOSIS — M722 Plantar fascial fibromatosis: Secondary | ICD-10-CM

## 2021-07-27 NOTE — Progress Notes (Cosign Needed)
Reason for Visit:        Fitting and Delivery of Custom Fabricated Foot Orthotics Patient Report:            Patient reports comfort and is satisfied with device.   ACTIONS PERFORMED Patient was fit with foot orthotics trimmed to shoe last. Patient tolerated fitting procedure.    Patient was provided with verbal and written instruction and demonstration regarding  wear, care, proper fit, function, purpose, cleaning, and use of the orthosis and in all related precautions and risks and benefits regarding the orthosis.   Patient was also provided with verbal instruction regarding how to report any failures or malfunctions of the orthosis and necessary follow up care. Patient was also instructed to contact our office regarding any change in status that may affect the function of the orthosis.   Patient demonstrated independence with proper donning, doffing, and fit and verbalized understanding of all instructions.   PLAN: Patient is to follow up in one week or as necessary (PRN). All questions were answered and concerns addressed. 

## 2021-08-15 DIAGNOSIS — Z6827 Body mass index (BMI) 27.0-27.9, adult: Secondary | ICD-10-CM | POA: Diagnosis not present

## 2021-08-15 DIAGNOSIS — Z1329 Encounter for screening for other suspected endocrine disorder: Secondary | ICD-10-CM | POA: Diagnosis not present

## 2021-08-15 DIAGNOSIS — Z130 Encounter for screening for diseases of the blood and blood-forming organs and certain disorders involving the immune mechanism: Secondary | ICD-10-CM | POA: Diagnosis not present

## 2021-08-15 DIAGNOSIS — Z124 Encounter for screening for malignant neoplasm of cervix: Secondary | ICD-10-CM | POA: Diagnosis not present

## 2021-08-15 DIAGNOSIS — Z131 Encounter for screening for diabetes mellitus: Secondary | ICD-10-CM | POA: Diagnosis not present

## 2021-08-15 DIAGNOSIS — Z1151 Encounter for screening for human papillomavirus (HPV): Secondary | ICD-10-CM | POA: Diagnosis not present

## 2021-08-15 DIAGNOSIS — Z13220 Encounter for screening for lipoid disorders: Secondary | ICD-10-CM | POA: Diagnosis not present

## 2021-08-15 DIAGNOSIS — Z01419 Encounter for gynecological examination (general) (routine) without abnormal findings: Secondary | ICD-10-CM | POA: Diagnosis not present

## 2021-10-09 ENCOUNTER — Other Ambulatory Visit: Payer: Self-pay | Admitting: Family Medicine

## 2021-10-09 DIAGNOSIS — Z1231 Encounter for screening mammogram for malignant neoplasm of breast: Secondary | ICD-10-CM

## 2021-10-13 ENCOUNTER — Ambulatory Visit (INDEPENDENT_AMBULATORY_CARE_PROVIDER_SITE_OTHER): Payer: BC Managed Care – PPO | Admitting: Physician Assistant

## 2021-10-13 ENCOUNTER — Encounter: Payer: Self-pay | Admitting: Physician Assistant

## 2021-10-13 VITALS — BP 124/84 | HR 78 | Temp 98.0°F | Ht 63.39 in | Wt 159.8 lb

## 2021-10-13 DIAGNOSIS — Z23 Encounter for immunization: Secondary | ICD-10-CM

## 2021-10-13 DIAGNOSIS — H6501 Acute serous otitis media, right ear: Secondary | ICD-10-CM | POA: Diagnosis not present

## 2021-10-13 NOTE — Patient Instructions (Signed)
Welcome to [facility]! It was a pleasure meeting you today.  Please take Sudafed x 3-5 days Flonase x 2 weeks Daily allergy medication  ENT if worse or no improvement  PLEASE NOTE:  If you had any LAB tests please let us know if you have not heard back within a few days. You may see your results on MyChart before we have a chance to review them but we will give you a call once they are reviewed by [place]. If we ordered any REFERRALS today, please let us know if you have not heard from their office within the next two weeks. Let us know through MyChart if you are needing REFILLS, or have your pharmacy send [place] the request. You can also use MyChart to communicate with me or any office staff.  Please try these tips to maintain a healthy lifestyle:  Eat most of your calories during the day when you are active. Eliminate processed foods including packaged sweets (pies, cakes, cookies), reduce intake of potatoes, white bread, white pasta, and white rice. Look for whole grain options, oat flour or almond flour.  Each meal should contain half fruits/vegetables, one quarter protein, and one quarter carbs (no bigger than a computer mouse).  Cut down on sweet beverages. This includes juice, soda, and sweet tea. Also watch fruit intake, though this is a healthier sweet option, it still contains natural sugar! Limit to 3 servings daily.  Drink at least 1 glass of water with each meal and aim for at least 8 glasses (64 ounces) per day.  Exercise at least 150 minutes every week to the best of your ability.    Take Care,  Gracen Ringwald, PA-C

## 2021-10-13 NOTE — Progress Notes (Signed)
Subjective:    Patient ID: Ellen Hayden, female    DOB: [birth year], 56 y.o.   MRN: [id number]  Chief Complaint  Patient presents with   New Patient (Initial Visit)    New pt in office to establish care; goes to GYN every year for exam and does labs there but not seen a PCP in years; low grade ear ache in both ears for past few months, not pain but tinges of discomfort from time to time, frequently has sinus infections as well;    HPI 56 y.o. patient presents today for new patient establishment with me.  Patient hasn't had PCP in awhile. Ellen Hayden and Ellen Hayden   Current Care Team: GYN - Dr. 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   mild dysplasia with no treatment and normal paps since   Colon polyps    Plantar fasciitis of right foot     Past Surgical History:  Procedure Laterality Date   APPENDECTOMY  2016   CATARACT EXTRACTION     CESAREAN SECTION  2007   UNFAVORABLE CERVIX-4200 GRAMS   CESAREAN SECTION  2009   Stephen N/A 07/27/2014   Procedure: APPENDECTOMY LAPAROSCOPIC;  Surgeon: Johnathan Hausen, MD;  Location: [facility];  Service: General;  Laterality: N/A;    Family History  Problem Relation Age of Onset   Hyperlipidemia Mother    Hypertension Father    Atrial fibrillation Father    Colon polyps Brother    Cancer Maternal Grandmother         THROAT - SMOKER   Cancer Paternal Grandmother        LIVER    Social History   Tobacco Use   Smoking status: Never   Smokeless tobacco: Never  Vaping Use   Vaping Use: Never used  Substance Use Topics   Alcohol use: Not Currently    Alcohol/week: 0.0 standard drinks of alcohol    Comment: Rare   Drug use: No     Allergies  Allergen Reactions   Ciprofloxacin     REACTION: rash when had fever    Review of Systems NEGATIVE UNLESS OTHERWISE INDICATED IN HPI      Objective:     BP 124/84 (BP Location: Left Arm)   Pulse Ellen   Temp 98 F (36.7 C) (Temporal)   Ht 5' 3.39" (1.61 m)   Wt 159 lb 12.8 oz (72.5 kg)   SpO2 99%   BMI 27.96 kg/m   Wt Readings from Last 3 Encounters:  10/13/21 159 lb 12.8 oz (72.5 kg)  07/24/18 159 lb (72.1 kg)  07/19/17 155 lb (70.3 kg)    BP Readings from Last 3 Encounters:  10/13/21 124/84  07/24/18 116/76  07/19/17 118/76     Physical Exam Constitutional:      Appearance: Normal appearance.  HENT:  Head: Normocephalic.     Right Ear: Ear canal and external ear normal. A middle ear effusion is present. There is no impacted cerumen. Tympanic membrane is not erythematous, retracted or bulging.     Left Ear: Ear canal and external ear normal.  No middle ear effusion. There is no impacted cerumen. Tympanic membrane is not erythematous, retracted or bulging.  Neurological:     General: No focal deficit present.     Mental Status: She is alert and oriented to person, place, and time.  Psychiatric:        Mood and Affect: Mood normal.        Behavior: Behavior normal.        Thought Content: Thought content normal.        Assessment & Plan:  Right acute serous otitis media, recurrence not specified -     Ambulatory referral to ENT  Need for prophylactic vaccination with combined diphtheria-tetanus-pertussis (DTP) vaccine -     Tdap vaccine greater than or equal to 7yo IM  Need for immunization against influenza -     Flu  Vaccine QUAD 72moIM (Fluarix, Fluzone & Alfiuria Quad PF)     Pleasant new patient, overall in good health, regular f/up and labs with GYN. R serous otitis media present - informed usually no antibiotics necessary. Offered steroid treatment, declined. Will start on Sudafed OTC x 3-5 days; Flonase x 2 weeks, daily allergy medication. Referral to ENT if worse or no imp.   Return if symptoms worsen or fail to improve.    Kalif Kattner M Tamecka Milham, PA-C

## 2021-10-18 ENCOUNTER — Encounter: Payer: Self-pay | Admitting: Physician Assistant

## 2021-10-25 DIAGNOSIS — J343 Hypertrophy of nasal turbinates: Secondary | ICD-10-CM | POA: Diagnosis not present

## 2021-10-25 DIAGNOSIS — J342 Deviated nasal septum: Secondary | ICD-10-CM | POA: Insufficient documentation

## 2021-10-25 DIAGNOSIS — H6993 Unspecified Eustachian tube disorder, bilateral: Secondary | ICD-10-CM | POA: Diagnosis not present

## 2021-10-25 DIAGNOSIS — J302 Other seasonal allergic rhinitis: Secondary | ICD-10-CM | POA: Diagnosis not present

## 2021-11-06 ENCOUNTER — Ambulatory Visit
Admission: RE | Admit: 2021-11-06 | Discharge: 2021-11-06 | Disposition: A | Discharge status: Home / Self Care | Payer: BC Managed Care – PPO | Source: Ambulatory Visit | Attending: Family Medicine | Admitting: Family Medicine

## 2021-11-06 DIAGNOSIS — Z1231 Encounter for screening mammogram for malignant neoplasm of breast: Secondary | ICD-10-CM

## 2021-11-09 ENCOUNTER — Other Ambulatory Visit: Payer: Self-pay | Admitting: Family Medicine

## 2021-11-09 DIAGNOSIS — R928 Other abnormal and inconclusive findings on diagnostic imaging of breast: Secondary | ICD-10-CM

## 2021-11-23 ENCOUNTER — Ambulatory Visit
Admission: RE | Admit: 2021-11-23 | Discharge: 2021-11-23 | Disposition: A | Discharge status: Home / Self Care | Payer: BC Managed Care – PPO | Source: Ambulatory Visit | Attending: Family Medicine | Admitting: Family Medicine

## 2021-11-23 ENCOUNTER — Other Ambulatory Visit: Payer: Self-pay | Admitting: Family Medicine

## 2021-11-23 DIAGNOSIS — N6489 Other specified disorders of breast: Secondary | ICD-10-CM

## 2021-11-23 DIAGNOSIS — R928 Other abnormal and inconclusive findings on diagnostic imaging of breast: Secondary | ICD-10-CM

## 2021-11-23 DIAGNOSIS — R92322 Mammographic fibroglandular density, left breast: Secondary | ICD-10-CM | POA: Diagnosis not present

## 2021-12-05 ENCOUNTER — Ambulatory Visit
Admission: RE | Admit: 2021-12-05 | Discharge: 2021-12-05 | Disposition: A | Discharge status: Home / Self Care | Payer: BC Managed Care – PPO | Source: Ambulatory Visit | Attending: Family Medicine | Admitting: Family Medicine

## 2021-12-05 ENCOUNTER — Other Ambulatory Visit: Payer: Self-pay | Admitting: Family Medicine

## 2021-12-05 DIAGNOSIS — N6489 Other specified disorders of breast: Secondary | ICD-10-CM | POA: Diagnosis not present

## 2021-12-05 DIAGNOSIS — R928 Other abnormal and inconclusive findings on diagnostic imaging of breast: Secondary | ICD-10-CM | POA: Diagnosis not present

## 2021-12-05 HISTORY — PX: BREAST BIOPSY: SHX20

## 2021-12-20 ENCOUNTER — Ambulatory Visit
Admission: RE | Admit: 2021-12-20 | Discharge: 2021-12-20 | Disposition: A | Discharge status: Home / Self Care | Payer: BC Managed Care – PPO | Source: Ambulatory Visit | Attending: Family Medicine | Admitting: Family Medicine

## 2021-12-20 DIAGNOSIS — N6489 Other specified disorders of breast: Secondary | ICD-10-CM

## 2021-12-20 DIAGNOSIS — R928 Other abnormal and inconclusive findings on diagnostic imaging of breast: Secondary | ICD-10-CM | POA: Diagnosis not present

## 2021-12-20 HISTORY — PX: BREAST BIOPSY: SHX20

## 2022-06-14 ENCOUNTER — Ambulatory Visit (INDEPENDENT_AMBULATORY_CARE_PROVIDER_SITE_OTHER): Payer: BC Managed Care – PPO | Admitting: Physician Assistant

## 2022-06-14 ENCOUNTER — Encounter: Payer: Self-pay | Admitting: Physician Assistant

## 2022-06-14 VITALS — BP 122/84 | HR 75 | Temp 97.1°F | Ht 63.0 in | Wt 160.6 lb

## 2022-06-14 DIAGNOSIS — R5383 Other fatigue: Secondary | ICD-10-CM | POA: Diagnosis not present

## 2022-06-14 DIAGNOSIS — Z13220 Encounter for screening for lipoid disorders: Secondary | ICD-10-CM | POA: Diagnosis not present

## 2022-06-14 DIAGNOSIS — H9201 Otalgia, right ear: Secondary | ICD-10-CM

## 2022-06-14 DIAGNOSIS — Z131 Encounter for screening for diabetes mellitus: Secondary | ICD-10-CM

## 2022-06-14 LAB — LIPID PANEL
Cholesterol: 183 mg/dL (ref 0–200)
HDL: 61 mg/dL (ref >39.00)
LDL Cholesterol: 99 mg/dL (ref 0–99)
NonHDL: 122.38
Total CHOL/HDL Ratio: 3
Triglycerides: 115 mg/dL (ref 0.0–149.0)
VLDL: 23 mg/dL (ref 0.0–40.0)

## 2022-06-14 LAB — CBC WITH DIFFERENTIAL/PLATELET
Basophils Absolute: 0.1 10*3/uL (ref 0.0–0.1)
Basophils Relative: 0.7 % (ref 0.0–3.0)
Eosinophils Absolute: 0.2 10*3/uL (ref 0.0–0.7)
Eosinophils Relative: 1.3 % (ref 0.0–5.0)
HCT: 39.8 % (ref 36.0–46.0)
Hemoglobin: 12.9 g/dL (ref 12.0–15.0)
Lymphocytes Relative: 19.6 % (ref 12.0–46.0)
Lymphs Abs: 2.5 10*3/uL (ref 0.7–4.0)
MCHC: 32.3 g/dL (ref 30.0–36.0)
MCV: 95.1 fl (ref 78.0–100.0)
Monocytes Absolute: 1 10*3/uL (ref 0.1–1.0)
Monocytes Relative: 7.7 % (ref 3.0–12.0)
Neutro Abs: 8.8 10*3/uL — ABNORMAL HIGH (ref 1.4–7.7)
Neutrophils Relative %: 70.7 % (ref 43.0–77.0)
Platelets: 240 10*3/uL (ref 150.0–400.0)
RBC: 4.18 Mil/uL (ref 3.87–5.11)
RDW: 14.4 % (ref 11.5–15.5)
WBC: 12.5 10*3/uL — ABNORMAL HIGH (ref 4.0–10.5)

## 2022-06-14 LAB — COMPREHENSIVE METABOLIC PANEL
ALT: 16 U/L (ref 0–35)
AST: 12 U/L (ref 0–37)
Albumin: 4 g/dL (ref 3.5–5.2)
Alkaline Phosphatase: 55 U/L (ref 39–117)
BUN: 15 mg/dL (ref 6–23)
CO2: 24 mEq/L (ref 19–32)
Calcium: 8.8 mg/dL (ref 8.4–10.5)
Chloride: 103 mEq/L (ref 96–112)
Creatinine, Ser: 0.79 mg/dL (ref 0.40–1.20)
GFR: 83.81 mL/min (ref >60.00)
Glucose, Bld: 86 mg/dL (ref 70–99)
Potassium: 4.2 mEq/L (ref 3.5–5.1)
Sodium: 137 mEq/L (ref 135–145)
Total Bilirubin: 0.6 mg/dL (ref 0.2–1.2)
Total Protein: 6.8 g/dL (ref 6.0–8.3)

## 2022-06-14 LAB — TSH: TSH: 1.88 u[IU]/mL (ref 0.35–5.50)

## 2022-06-14 NOTE — Progress Notes (Signed)
Subjective:    Patient ID: Ellen Hayden, female    DOB: [birth year], 57 y.o.   MRN: [id number]  Chief Complaint  Patient presents with   Ear Pain    Pt in office c/o ear pain; pt states this past weekend right ear started bothering her and, very tender to touch around ear, thinks she may have got some water in her ear in the shower.     HPI Patient is in today for R ear pain x 5 days. Seems better today. Started after getting warm shower water in her ear. No ringing or discharge. No dizziness or HA. Noted little ball of swelling in front of her ear when symptoms started, but feels better now.   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   mild dysplasia with no treatment and normal paps since   Colon polyps    Plantar fasciitis of right foot     Past Surgical History:  Procedure Laterality Date   APPENDECTOMY  2016   BREAST BIOPSY Left 12/05/2021   MM LT BREAST BX W LOC DEV 1ST LESION IMAGE BX SPEC STEREO GUIDE 12/05/2021 [facility]   BREAST BIOPSY Left 12/20/2021   MM LT BREAST BX W LOC DEV 1ST LESION IMAGE BX SPEC STEREO GUIDE 12/20/2021 [facility]   CATARACT EXTRACTION     CESAREAN SECTION  2007   UNFAVORABLE CERVIX-4200 GRAMS   CESAREAN SECTION  2009   COLPOSCOPY     EYE SURGERY  1970   STRABISMUS   HERNIA REPAIR  1968   LAPAROSCOPIC APPENDECTOMY N/A 07/27/2014   Procedure: APPENDECTOMY LAPAROSCOPIC;  Surgeon: Luretha Murphy, MD;  Location: [facility];  Service: General;  Laterality: N/A;    Family History  Problem Relation Age of Onset   Hyperlipidemia Mother    Hypertension Father    Atrial fibrillation Father    Colon polyps Brother    Cancer Maternal Grandmother        THROAT - SMOKER   Cancer Paternal Grandmother        LIVER    Social History   Tobacco Use   Smoking status: Never   Smokeless tobacco: Never  Vaping Use   Vaping Use: Never used  Substance Use Topics   Alcohol use: Not  Currently    Alcohol/week: 0.0 standard drinks of alcohol    Comment: Rare   Drug use: No     Allergies  Allergen Reactions   Ciprofloxacin     REACTION: rash when had fever    Review of Systems NEGATIVE UNLESS OTHERWISE INDICATED IN HPI      Objective:     BP 122/84 (BP Location: Left Arm)   Pulse 75   Temp (!) 97.1 F (36.2 C) (Temporal)   Ht 5\' 3"  (1.6 m)   Wt 160 lb 9.6 oz (72.8 kg)   SpO2 99%   BMI 28.45 kg/m   Wt Readings from Last 3 Encounters:  06/14/22 160 lb 9.6 oz (72.8 kg)  10/13/21 159 lb 12.8 oz (72.5 kg)  07/24/18 159 lb (72.1 kg)    BP Readings from Last 3 Encounters:  06/14/22 122/84  10/13/21 124/84  07/24/18 116/76     Physical Exam Vitals and nursing note reviewed.  Constitutional:      Appearance: Normal appearance.  HENT:     Head: Normocephalic.     Right Ear: Tympanic membrane, ear canal and external ear normal.  Left Ear: Tympanic membrane, ear canal and external ear normal.  Lymphadenopathy:     Head:     Right side of head: No preauricular or posterior auricular adenopathy.     Left side of head: No preauricular or posterior auricular adenopathy.     Cervical: No cervical adenopathy.  Skin:    Findings: No rash.  Neurological:     Mental Status: She is alert.  Psychiatric:        Mood and Affect: Mood normal.        Assessment & Plan:  Right ear pain  Diabetes mellitus screening -     Comprehensive metabolic panel  Screening for cholesterol level -     Lipid panel  Other fatigue -     CBC with Differential/Platelet -     Comprehensive metabolic panel -     TSH   Reassured that ear exam was normal. May take ibuprofen to help with any residual pain or discomfort.  Need to update labs today, she is fasting. Treat any abnormal results.     Return if symptoms worsen or fail to improve.    Altair Appenzeller M Deondria Puryear, PA-C

## 2022-06-19 ENCOUNTER — Other Ambulatory Visit: Payer: Self-pay

## 2022-06-19 DIAGNOSIS — D72829 Elevated white blood cell count, unspecified: Secondary | ICD-10-CM

## 2022-08-23 DIAGNOSIS — Z13220 Encounter for screening for lipoid disorders: Secondary | ICD-10-CM | POA: Diagnosis not present

## 2022-08-23 DIAGNOSIS — Z1151 Encounter for screening for human papillomavirus (HPV): Secondary | ICD-10-CM | POA: Diagnosis not present

## 2022-08-23 DIAGNOSIS — Z01419 Encounter for gynecological examination (general) (routine) without abnormal findings: Secondary | ICD-10-CM | POA: Diagnosis not present

## 2022-08-23 DIAGNOSIS — Z124 Encounter for screening for malignant neoplasm of cervix: Secondary | ICD-10-CM | POA: Diagnosis not present

## 2022-08-23 DIAGNOSIS — Z131 Encounter for screening for diabetes mellitus: Secondary | ICD-10-CM | POA: Diagnosis not present

## 2022-08-23 DIAGNOSIS — Z1329 Encounter for screening for other suspected endocrine disorder: Secondary | ICD-10-CM | POA: Diagnosis not present

## 2022-08-23 DIAGNOSIS — Z1321 Encounter for screening for nutritional disorder: Secondary | ICD-10-CM | POA: Diagnosis not present

## 2022-08-23 DIAGNOSIS — Z6828 Body mass index (BMI) 28.0-28.9, adult: Secondary | ICD-10-CM | POA: Diagnosis not present

## 2022-08-31 DIAGNOSIS — N926 Irregular menstruation, unspecified: Secondary | ICD-10-CM | POA: Diagnosis not present

## 2022-08-31 DIAGNOSIS — D72829 Elevated white blood cell count, unspecified: Secondary | ICD-10-CM | POA: Diagnosis not present

## 2022-08-31 DIAGNOSIS — E785 Hyperlipidemia, unspecified: Secondary | ICD-10-CM | POA: Diagnosis not present

## 2022-09-02 DIAGNOSIS — Z1212 Encounter for screening for malignant neoplasm of rectum: Secondary | ICD-10-CM | POA: Diagnosis not present

## 2022-09-02 DIAGNOSIS — Z1211 Encounter for screening for malignant neoplasm of colon: Secondary | ICD-10-CM | POA: Diagnosis not present

## 2022-09-07 LAB — COLOGUARD: COLOGUARD: NEGATIVE

## 2022-10-03 ENCOUNTER — Other Ambulatory Visit: Payer: Self-pay | Admitting: Family Medicine

## 2022-10-03 DIAGNOSIS — Z0000 Encounter for general adult medical examination without abnormal findings: Secondary | ICD-10-CM

## 2022-11-09 ENCOUNTER — Ambulatory Visit
Admission: RE | Admit: 2022-11-09 | Discharge: 2022-11-09 | Disposition: A | Discharge status: Home / Self Care | Payer: BC Managed Care – PPO | Source: Ambulatory Visit | Attending: Family Medicine | Admitting: Family Medicine

## 2022-11-09 DIAGNOSIS — Z0000 Encounter for general adult medical examination without abnormal findings: Secondary | ICD-10-CM

## 2022-11-09 DIAGNOSIS — Z1231 Encounter for screening mammogram for malignant neoplasm of breast: Secondary | ICD-10-CM | POA: Diagnosis not present

## 2023-10-09 ENCOUNTER — Other Ambulatory Visit: Payer: Self-pay | Admitting: Family

## 2023-10-09 DIAGNOSIS — Z0000 Encounter for general adult medical examination without abnormal findings: Secondary | ICD-10-CM

## 2023-11-13 ENCOUNTER — Ambulatory Visit
Admission: RE | Admit: 2023-11-13 | Discharge: 2023-11-13 | Disposition: A | Discharge status: Home / Self Care | Source: Ambulatory Visit | Attending: Family | Admitting: Family

## 2023-11-13 DIAGNOSIS — Z0000 Encounter for general adult medical examination without abnormal findings: Secondary | ICD-10-CM
